# Patient Record
Sex: Female | Born: 1990 | Race: White | Hispanic: No | Marital: Married | Smoking: Current every day smoker
Health system: Southern US, Community
[De-identification: ages and names within clinical notes are randomized; demographics above are authoritative.]

## PROBLEM LIST (undated history)

## (undated) DIAGNOSIS — F32A Depression, unspecified: Secondary | ICD-10-CM

## (undated) DIAGNOSIS — N83209 Unspecified ovarian cyst, unspecified side: Secondary | ICD-10-CM

## (undated) DIAGNOSIS — F4310 Post-traumatic stress disorder, unspecified: Secondary | ICD-10-CM

## (undated) DIAGNOSIS — F329 Major depressive disorder, single episode, unspecified: Secondary | ICD-10-CM

## (undated) DIAGNOSIS — F419 Anxiety disorder, unspecified: Secondary | ICD-10-CM

## (undated) DIAGNOSIS — R569 Unspecified convulsions: Secondary | ICD-10-CM

## (undated) HISTORY — PX: OTHER SURGICAL HISTORY: SHX169

## (undated) HISTORY — DX: Unspecified convulsions: R56.9

---
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
## 2009-02-20 ENCOUNTER — Emergency Department: Payer: Self-pay | Admitting: Emergency Medicine

## 2009-04-30 ENCOUNTER — Ambulatory Visit: Payer: Self-pay | Admitting: Internal Medicine

## 2009-10-21 ENCOUNTER — Emergency Department: Payer: Self-pay | Admitting: Emergency Medicine

## 2010-07-16 ENCOUNTER — Emergency Department: Payer: Self-pay

## 2010-09-30 ENCOUNTER — Ambulatory Visit: Payer: Self-pay | Admitting: Gastroenterology

## 2012-01-17 ENCOUNTER — Ambulatory Visit: Payer: Self-pay | Admitting: Family Medicine

## 2012-01-21 ENCOUNTER — Emergency Department: Payer: Self-pay | Admitting: *Deleted

## 2012-01-22 LAB — COMPREHENSIVE METABOLIC PANEL
Albumin: 3.2 g/dL — ABNORMAL LOW (ref 3.4–5.0)
Alkaline Phosphatase: 52 U/L (ref 50–136)
Anion Gap: 10 (ref 7–16)
BUN: 10 mg/dL (ref 7–18)
Bilirubin,Total: 0.1 mg/dL — ABNORMAL LOW (ref 0.2–1.0)
Chloride: 106 mmol/L (ref 98–107)
Co2: 27 mmol/L (ref 21–32)
EGFR (Non-African Amer.): >60
Glucose: 90 mg/dL (ref 65–99)
Potassium: 3.6 mmol/L (ref 3.5–5.1)
SGOT(AST): 21 U/L (ref 15–37)
Sodium: 143 mmol/L (ref 136–145)
Total Protein: 6.9 g/dL (ref 6.4–8.2)

## 2012-01-22 LAB — CBC
HGB: 12.3 g/dL (ref 12.0–16.0)
MCHC: 34.2 g/dL (ref 32.0–36.0)
RBC: 4.03 10*6/uL (ref 3.80–5.20)
RDW: 13.4 % (ref 11.5–14.5)

## 2012-01-22 LAB — URINALYSIS, COMPLETE
Bacteria: NONE SEEN
Blood: NEGATIVE
Glucose,UR: NEGATIVE mg/dL (ref 0–75)
Ketone: NEGATIVE
Leukocyte Esterase: NEGATIVE
Nitrite: NEGATIVE
Ph: 6 (ref 4.5–8.0)
Protein: NEGATIVE
Squamous Epithelial: 1

## 2012-01-22 LAB — PREGNANCY, URINE: Pregnancy Test, Urine: NEGATIVE m[IU]/mL

## 2012-11-15 ENCOUNTER — Emergency Department (HOSPITAL_COMMUNITY)
Admission: EM | Admit: 2012-11-15 | Discharge: 2012-11-15 | Disposition: A | Discharge status: Home / Self Care | Payer: Self-pay | Attending: Emergency Medicine | Admitting: Emergency Medicine

## 2012-11-15 ENCOUNTER — Encounter (HOSPITAL_COMMUNITY): Payer: Self-pay

## 2012-11-15 ENCOUNTER — Emergency Department (HOSPITAL_COMMUNITY): Payer: Self-pay

## 2012-11-15 DIAGNOSIS — X500XXA Overexertion from strenuous movement or load, initial encounter: Secondary | ICD-10-CM | POA: Insufficient documentation

## 2012-11-15 DIAGNOSIS — Y9210 Unspecified residential institution as the place of occurrence of the external cause: Secondary | ICD-10-CM | POA: Insufficient documentation

## 2012-11-15 DIAGNOSIS — Y93F9 Activity, other caregiving: Secondary | ICD-10-CM | POA: Insufficient documentation

## 2012-11-15 DIAGNOSIS — IMO0002 Reserved for concepts with insufficient information to code with codable children: Secondary | ICD-10-CM | POA: Insufficient documentation

## 2012-11-15 DIAGNOSIS — F17200 Nicotine dependence, unspecified, uncomplicated: Secondary | ICD-10-CM | POA: Insufficient documentation

## 2012-11-15 DIAGNOSIS — Y990 Civilian activity done for income or pay: Secondary | ICD-10-CM | POA: Insufficient documentation

## 2012-11-15 DIAGNOSIS — S0993XA Unspecified injury of face, initial encounter: Secondary | ICD-10-CM | POA: Insufficient documentation

## 2012-11-15 MED ORDER — CYCLOBENZAPRINE HCL 5 MG PO TABS: 5.0000 mg | ORAL_TABLET | Freq: Three times a day (TID) | ORAL | Status: DC | PRN

## 2012-11-15 NOTE — ED Notes (Signed)
Patient transported to X-ray 

## 2012-11-15 NOTE — ED Provider Notes (Signed)
History     CSN: [id number]  Arrival date & time 11/15/12  0865   First MD Initiated Contact with Patient 11/15/12 1853      Chief Complaint  Patient presents with  . Shoulder Pain    (Consider location/radiation/quality/duration/timing/severity/associated sxs/prior treatment) Patient is a 22 y.o. female presenting with shoulder pain. The history is provided by the patient.  Shoulder Pain This is a new problem. The current episode started yesterday. The problem occurs constantly. The problem has been gradually worsening. Pertinent negatives include no chills, fever or headaches. Neck pain: neck pain referred from the shoulder. Exacerbated by: moving right arm. She has tried NSAIDs for the symptoms. The treatment provided mild relief.  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 Pain in left shoulder started last nigh while sitting on the couch. The pain has gotten gradually worse. The pain increases with movement of right arm or pressure at the area of the right shoulder.   History reviewed. No pertinent past medical history.  Past Surgical History  Procedure Laterality Date  . Cesarean section    . Tubes in ears      No family history on file.  History  Substance Use Topics  . Smoking status: Current Every Day Smoker  . Smokeless tobacco: Not on file  . Alcohol Use: Yes     Comment: occ    OB History   Grav Para Term Preterm Abortions TAB SAB Ect Mult Living                  Review of Systems  Constitutional: Negative for fever and chills.  HENT: Neck pain: neck pain referred from the shoulder.   Musculoskeletal:       Right shoulder pain/  Skin: Negative for wound.  Neurological: Negative for headaches.  Psychiatric/Behavioral: Negative for confusion. The patient is not nervous/anxious.     Allergies  Review of patient's allergies indicates no known allergies.  Home Medications   Current Outpatient Rx  Name  Route  Sig  Dispense  Refill  .  buPROPion (WELLBUTRIN SR) 150 MG 12 hr tablet   Oral   Take 150 mg by mouth 2 (two) times daily.           BP 156/94  Pulse 88  Temp(Src) 99.1 F (37.3 C) (Oral)  Resp 18  Ht 5\' 5"  (1.651 m)  Wt 167 lb (75.751 kg)  BMI 27.79 kg/m2  SpO2 99%  LMP 10/30/2012  Physical Exam  Nursing note and vitals reviewed. Constitutional: She is oriented to person, place, and time. She appears well-developed and well-nourished. No distress.  HENT:  Head: Normocephalic and atraumatic.  Eyes: EOM are normal.  Neck: Normal range of motion. Neck supple.  Cardiovascular: Normal rate.   Pulmonary/Chest: Effort normal.  Musculoskeletal:       Right shoulder: She exhibits decreased range of motion, tenderness and spasm. She exhibits no deformity and normal strength.       Arms: Radial pulses strong and equal bilateral. Adequate circulation. Good grips and equal bilateral. Muscle spasm noted posterior aspect of right shoulder extending to scapula.  Neurological: She is alert and oriented to person, place, and time. She has normal strength and normal reflexes. No cranial nerve deficit or sensory deficit.  Skin: Skin is warm and dry.  Psychiatric: She has a normal mood and affect. Her behavior is normal. Judgment and thought content normal.    ED Course  Procedures (including critical care time)  Labs Reviewed - No data to display Dg Shoulder Right  11/15/2012  *RADIOLOGY REPORT*  Clinical Data: Right shoulder pain since last night, no injury  RIGHT SHOULDER - 2+ VIEW  Comparison: None  Findings: AC joint alignment normal. Osseous mineralization normal. No glenohumeral fracture or dislocation. Visualized ribs intact.  IMPRESSION: Normal exam.   Original Report Authenticated By: Ulyses Southward, M.D.     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  The x-ray of the shoulder is normal and patient has no abnormal  neurological findings I will treat her muscle spasm and have her  follow up with ortho as needed.  assessment: Right shoulder strain   Muscle spasm  Plan:  Flexeril   Ibuprofen    I have reviewed this patient's vital signs, nurses notes, appropriate imaging.  I have discussed the results and plan of care with the patient and she voices understanding.    Medication List    TAKE these medications       cyclobenzaprine 5 MG tablet  Commonly known as:  FLEXERIL  Take 1 tablet (5 mg total) by mouth 3 (three) times daily as needed for muscle spasms.      ASK your doctor about these medications       buPROPion 150 MG 12 hr tablet  Commonly known as:  WELLBUTRIN SR  Take 150 mg by mouth 2 (two) times daily.              Janne Napoleon, [place] 11/15/12 2009

## 2012-11-15 NOTE — ED Notes (Signed)
Pt c/o pain in r shoulder that has been gradually getting worse throughout the day.  Posterior shoulder swollen and pain radiates down r arm.

## 2012-11-15 NOTE — ED Notes (Signed)
Pt alert & oriented x4, stable gait. Patient given discharge instructions, paperwork & prescription(s). Patient  instructed to stop at the registration desk to finish any additional paperwork. Patient verbalized understanding. Pt left department w/ no further questions. 

## 2012-11-16 NOTE — ED Provider Notes (Signed)
Medical screening examination/treatment/procedure(s) were performed by non-physician practitioner and as supervising physician I was immediately available for consultation/collaboration. Devoria Albe, MD, Armando Gang   Ward Givens, MD 11/16/12 [phone]

## 2014-06-24 ENCOUNTER — Emergency Department (HOSPITAL_COMMUNITY)
Admission: EM | Admit: 2014-06-24 | Discharge: 2014-06-24 | Disposition: A | Discharge status: Home / Self Care | Payer: Self-pay | Attending: Emergency Medicine | Admitting: Emergency Medicine

## 2014-06-24 ENCOUNTER — Encounter (HOSPITAL_COMMUNITY): Payer: Self-pay | Admitting: Emergency Medicine

## 2014-06-24 ENCOUNTER — Emergency Department (HOSPITAL_COMMUNITY): Payer: Self-pay

## 2014-06-24 DIAGNOSIS — N83299 Other ovarian cyst, unspecified side: Secondary | ICD-10-CM

## 2014-06-24 DIAGNOSIS — N760 Acute vaginitis: Secondary | ICD-10-CM | POA: Insufficient documentation

## 2014-06-24 DIAGNOSIS — Z3202 Encounter for pregnancy test, result negative: Secondary | ICD-10-CM | POA: Insufficient documentation

## 2014-06-24 DIAGNOSIS — R1031 Right lower quadrant pain: Secondary | ICD-10-CM | POA: Insufficient documentation

## 2014-06-24 DIAGNOSIS — Z720 Tobacco use: Secondary | ICD-10-CM | POA: Insufficient documentation

## 2014-06-24 DIAGNOSIS — B9689 Other specified bacterial agents as the cause of diseases classified elsewhere: Secondary | ICD-10-CM

## 2014-06-24 DIAGNOSIS — Z79899 Other long term (current) drug therapy: Secondary | ICD-10-CM | POA: Insufficient documentation

## 2014-06-24 DIAGNOSIS — N8329 Other ovarian cysts: Secondary | ICD-10-CM | POA: Insufficient documentation

## 2014-06-24 HISTORY — DX: Unspecified ovarian cyst, unspecified side: N83.209

## 2014-06-24 LAB — WET PREP, GENITAL
Trich, Wet Prep: NONE SEEN
YEAST WET PREP: NONE SEEN

## 2014-06-24 LAB — URINALYSIS, ROUTINE W REFLEX MICROSCOPIC
BILIRUBIN URINE: NEGATIVE
Glucose, UA: NEGATIVE mg/dL
Ketones, ur: NEGATIVE mg/dL
Leukocytes, UA: NEGATIVE
NITRITE: NEGATIVE
Protein, ur: NEGATIVE mg/dL
UROBILINOGEN UA: 0.2 mg/dL (ref 0.0–1.0)
pH: 5.5 (ref 5.0–8.0)

## 2014-06-24 LAB — URINE MICROSCOPIC-ADD ON

## 2014-06-24 LAB — PREGNANCY, URINE: Preg Test, Ur: NEGATIVE

## 2014-06-24 MED ORDER — KETOROLAC TROMETHAMINE 60 MG/2ML IM SOLN
60.0000 mg | Freq: Once | INTRAMUSCULAR | Status: AC
  Administered 2014-06-24: 60 mg via INTRAMUSCULAR
  Filled 2014-06-24: qty 2

## 2014-06-24 MED ORDER — TRAMADOL HCL 50 MG PO TABS: 100.0000 mg | ORAL_TABLET | Freq: Four times a day (QID) | ORAL | Status: DC | PRN

## 2014-06-24 MED ORDER — METRONIDAZOLE 500 MG PO TABS: 500.0000 mg | ORAL_TABLET | Freq: Two times a day (BID) | ORAL | Status: DC

## 2014-06-24 NOTE — ED Provider Notes (Signed)
CSN: [id number]     Arrival date & time 06/24/14  [id number] History  This chart was scribed for Ward GivensIva L Kazuto Sevey, MD by Karle PlumberJennifer Tensley, ED Scribe. This patient was seen in room APA05/APA05 and the patient's care was started at 10:30 AM.  Chief Complaint  Patient presents with  . Abdominal Pain   HPI HPI Comments:  Deanna Wong is a 23 y.o. female who presents to the Emergency Department complaining of abdominal pain that began two days ago after sex. She reports a sharp pain to the RLQ two days ago. She reports a constant diffuse lower abdominal pain that radiates to her lower back since. She describes the pain as a dull ache and rates it at 6/10. She states bending over or walking makes the pain worse. Nothing makes it feel better except lying still. She reports associated difficulty urinating. Pt reports she started spotting two days ago. Pt is G2 P2 Ab ) She reports two pregnancies and two deliveries with no abortions or miscarriages. Her LNP was 9/23 so she is about 3 days early. Pt is sexually active with one partner.She states she has been trying to get pregnant again for the past 4 years. She does not use any type of birth control. She reports similar pain about 1 year ago when she had a ruptured ovarian cyst. Denies nausea, vomiting, diarrhea, fever or chills. She reports loss of appetite. She has frequency without dysuria and states she feels like it's hard to start urination.  [facility]  Past Medical History  Diagnosis Date  . Ovarian cyst    Past Surgical History  Procedure Laterality Date  . Cesarean section    . Tubes in ears     History reviewed. No pertinent family history. History  Substance Use Topics  . Smoking status: Current Every Day Smoker  . Smokeless tobacco: Not on file  . 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  Gastrointestinal: Positive for abdominal pain. Negative for nausea, vomiting and diarrhea.  Genitourinary: Positive for difficulty urinating.  All other systems reviewed and are negative.  Allergies  Review of patient's allergies indicates no known allergies.  Home Medications   Prior to Admission medications   Medication Sig Start Date End Date Taking? Authorizing Provider  buPROPion (WELLBUTRIN SR) 150 MG 12 hr tablet Take 150 mg by mouth 2 (two) times daily.   Yes Historical Provider, MD  citalopram (CELEXA) 20 MG tablet Take 20 mg by mouth at bedtime.   Yes Historical Provider, MD  ibuprofen (ADVIL,MOTRIN) 200 MG tablet Take 200 mg by mouth every 6 (six) hours as needed for cramping.   Yes Historical Provider, MD  metroNIDAZOLE (FLAGYL) 500 MG tablet Take 1 tablet (500 mg total) by mouth 2 (two) times daily. 06/24/14   Ward GivensIva L Savayah Waltrip, MD  traMADol (ULTRAM) 50 MG tablet Take 2 tablets (100 mg total) by mouth every 6 (six) hours as needed. 06/24/14   Ward GivensIva L Kasara Schomer, MD   Triage Vitals: BP 143/95  Pulse 97  Temp(Src) 98.9 F (37.2 C) (Oral)  Resp 16  Ht 5\' 5"  (1.651 m)  Wt 165 lb (74.844 kg)  BMI 27.46 kg/m2  SpO2 98%  LMP 06/24/2014   Vital signs normal    Physical Exam  Nursing note and vitals reviewed. Constitutional: She is oriented to person, place, and time. She appears well-developed and well-nourished.  Non-toxic appearance. She does not appear ill. No distress.  HENT:  Head: Normocephalic and atraumatic.  Right Ear: External ear normal.  Left Ear: External ear normal.  Nose: Nose normal. No mucosal edema or rhinorrhea.  Mouth/Throat: Oropharynx is clear and moist and mucous membranes are normal. No dental abscesses or uvula swelling.  Eyes: Conjunctivae and EOM are normal. Pupils are equal, round, and reactive to light.  Neck: Normal range of motion  and full passive range of motion without pain. Neck supple.  Cardiovascular: Normal rate, regular rhythm and normal heart sounds.  Exam reveals no gallop and no friction rub.   No murmur heard. Pulmonary/Chest: Effort normal and breath sounds normal. No respiratory distress. She has no wheezes. She has no rhonchi. She has no rales. She exhibits no tenderness and no crepitus.  Abdominal: Soft. Normal appearance and bowel sounds are normal. She exhibits no distension. There is tenderness in the right lower quadrant and suprapubic area. There is no rebound, no guarding and no CVA tenderness.    Mild epigastric tenderness and tenderness diffusely from umbilicus down, but worse at suprapubic region and RLQ. No CVAT bilaterally  Genitourinary:  Normal external genitilia. Pt has small amount of blood in the vault. She is tender diffusely to palpation of her normal sized uterus and both adenexa without obvious masses.   Musculoskeletal: Normal range of motion. She exhibits no edema and no tenderness.  Moves all extremities well. Indicates the pain is more at the sacral region.  Neurological: She is alert and oriented to person, place, and time. She has normal strength. No cranial nerve deficit.  Skin: Skin is warm, dry and intact. No rash noted. No erythema. No pallor.  Psychiatric: She has a normal mood and affect. Her speech is normal and behavior is normal. Her mood appears not anxious.    ED Course  Procedures (including critical care time)  Medications  ketorolac (TORADOL) injection 60 mg (60 mg Intramuscular Given 06/24/14 1050)    DIAGNOSTIC STUDIES: Oxygen Saturation is 98% on RA, normal by my interpretation.   COORDINATION OF CARE: 10:39 AM- Will order urinalysis, ultrasound, pain medication, and perform pelvic exam. Pt verbalizes understanding and agrees to plan.  PT driving herself home. The toradol helped until she had the [place], does not want to get acetaminophen before she leaves.    Labs Review Results for orders placed during the hospital encounter of 06/24/14  WET PREP, GENITAL      Result Value Ref Range   Yeast Wet Prep HPF POC NONE SEEN  NONE SEEN   Trich, Wet Prep NONE SEEN  NONE SEEN   Clue Cells Wet Prep HPF POC FEW (*) NONE SEEN   WBC, Wet Prep HPF POC RARE (*) NONE SEEN  URINALYSIS, ROUTINE W REFLEX MICROSCOPIC      Result Value Ref Range   Color, Urine YELLOW  YELLOW   APPearance CLEAR  CLEAR   Specific Gravity, Urine <1.005 (*) 1.005 - 1.030   pH 5.5  5.0 - 8.0   Glucose, UA NEGATIVE  NEGATIVE mg/dL   Hgb urine dipstick MODERATE (*) NEGATIVE   Bilirubin Urine NEGATIVE  NEGATIVE   Ketones, ur NEGATIVE  NEGATIVE mg/dL   Protein, ur  NEGATIVE  NEGATIVE mg/dL   Urobilinogen, UA 0.2  0.0 - 1.0 mg/dL   Nitrite NEGATIVE  NEGATIVE   Leukocytes, UA NEGATIVE  NEGATIVE  PREGNANCY, URINE      Result Value Ref Range   Preg Test, Ur NEGATIVE  NEGATIVE  URINE MICROSCOPIC-ADD ON      Result Value Ref Range   Squamous Epithelial / LPF FEW (*) RARE   RBC / HPF 7-10  <3 RBC/hpf     Laboratory interpretation all normal except BV   Imaging Review [place] Transvaginal Non-ob  [place] Pelvis Complete  06/24/2014   CLINICAL DATA:  Right lower quadrant abdominal pain  EXAM: TRANSABDOMINAL AND TRANSVAGINAL ULTRASOUND OF PELVIS  TECHNIQUE: Both transabdominal and transvaginal ultrasound examinations of the pelvis were performed. Transabdominal technique was performed for global imaging of the pelvis including uterus, ovaries, adnexal regions, and pelvic cul-de-sac. It was necessary to proceed with endovaginal exam following the transabdominal exam to visualize the ovaries and adnexae in better detail.  COMPARISON:  None  FINDINGS: Uterus  Measurements: 8.0 x 4.6 x 5.3 cm. No fibroids or other mass visualized.  Endometrium  Thickness: 9.2 mm.  No focal abnormality visualized.  Right ovary  Measurements: 4.0 x 2.9 x 3.2 cm. Right ovary contains a 2.6 x 1.4 x 2.6 cm minimally  complex septated cyst. No associated vascularity.  Left ovary  Measurements: 3.0 x 1.4 x 2.8 cm. Normal appearance/no adnexal mass.  Other findings  Only trace pelvic free fluid, likely physiologic.  IMPRESSION: 2.6 cm minimally complex septated right ovarian cyst. Otherwise normal exam.   Electronically Signed   By: Ruel Favorsrevor  Shick M.D.   On: 06/24/2014 12:18     EKG Interpretation None      MDM   Final diagnoses:  Abdominal pain, RLQ  BV (bacterial vaginosis)  Complex ovarian cyst    Discharge Medication List as of 06/24/2014  1:37 PM    flagyl 500 mg BID x 7d Tramadol # 16  Devoria AlbeIva Magnum Lunde, MD, FACEP    I personally performed the services described in this documentation, which was scribed in my presence. The recorded information has been reviewed and considered.  Devoria AlbeIva Lonnie Reth, MD, FACEP     Ward GivensIva L Gianny Killman, MD 06/24/14 [phone]

## 2014-06-24 NOTE — Discharge Instructions (Signed)
Take ibuprofen 600 mg + acetaminophen 1000 mg 4 times a day for pain. Take the tramadol for pain. Take the antibiotic until gone. Recheck if you get a fever, vomiting or the pain seems worse. You should have the cyst rechecked by a GYN in the next couple of weeks.    Bacterial Vaginosis Bacterial vaginosis is a vaginal infection that occurs when the normal balance of bacteria in the vagina is disrupted. It results from an overgrowth of certain bacteria. This is the most common vaginal infection in women of childbearing age. Treatment is important to prevent complications, especially in pregnant women, as it can cause a premature delivery. CAUSES  Bacterial vaginosis is caused by an increase in harmful bacteria that are normally present in smaller amounts in the vagina. Several different kinds of bacteria can cause bacterial vaginosis. However, the reason that the condition develops is not fully understood. RISK FACTORS Certain activities or behaviors can put you at an increased risk of developing bacterial vaginosis, including:  Having a new sex partner or multiple sex partners.  Douching.  Using an intrauterine device (IUD) for contraception. Women do not get bacterial vaginosis from toilet seats, bedding, swimming pools, or contact with objects around them. SIGNS AND SYMPTOMS  Some women with bacterial vaginosis have no signs or symptoms. Common symptoms include:  Grey vaginal discharge.  A fishlike odor with discharge, especially after sexual intercourse.  Itching or burning of the vagina and vulva.  Burning or pain with urination. DIAGNOSIS  Your health care provider will take a medical history and examine the vagina for signs of bacterial vaginosis. A sample of vaginal fluid may be taken. Your health care provider will look at this sample under a microscope to check for bacteria and abnormal cells. A vaginal pH test may also be done.  TREATMENT  Bacterial vaginosis may be treated  with antibiotic medicines. These may be given in the form of a pill or a vaginal cream. A second round of antibiotics may be prescribed if the condition comes back after treatment.  HOME CARE INSTRUCTIONS   Only take over-the-counter or prescription medicines as directed by your health care provider.  If antibiotic medicine was prescribed, take it as directed. Make sure you finish it even if you start to feel better.  Do not have sex until treatment is completed.  Tell all sexual partners that you have a vaginal infection. They should see their health care provider and be treated if they have problems, such as a mild rash or itching.  Practice safe sex by using condoms and only having one sex partner. SEEK MEDICAL CARE IF:   Your symptoms are not improving after 3 days of treatment.  You have increased discharge or pain.  You have a fever. MAKE SURE YOU:   Understand these instructions.  Will watch your condition.  Will get help right away if you are not doing well or get worse. FOR MORE INFORMATION  Centers for Disease Control and Prevention, Division of STD Prevention: SolutionApps.co.zawww.cdc.gov/std American Sexual Health Association (ASHA): www.ashastd.org  Document Released: 08/23/2005 Document Revised: 06/13/2013 Document Reviewed: 04/04/2013 [facility] Patient Information 2015 [place], [place]. This information is not intended to replace advice given to you by your health care provider. Make sure you discuss any questions you have with your health care provider.  Ovarian Cyst An ovarian cyst is a fluid-filled sac that forms on an ovary. The ovaries are small organs that produce eggs in women. Various types of cysts can form  on the ovaries. Most are not cancerous. Many do not cause problems, and they often go away on their own. Some may cause symptoms and require treatment. Common types of ovarian cysts include:  Functional cysts--These cysts may occur every month during the menstrual cycle.  This is normal. The cysts usually go away with the next menstrual cycle if the woman does not get pregnant. Usually, there are no symptoms with a functional cyst.  Endometrioma cysts--These cysts form from the tissue that lines the uterus. They are also called "chocolate cysts" because they become filled with blood that turns brown. This type of cyst can cause pain in the lower abdomen during intercourse and with your menstrual period.  Cystadenoma cysts--This type develops from the cells on the outside of the ovary. These cysts can get very big and cause lower abdomen pain and pain with intercourse. This type of cyst can twist on itself, cut off its blood supply, and cause severe pain. It can also easily rupture and cause a lot of pain.  Dermoid cysts--This type of cyst is sometimes found in both ovaries. These cysts may contain different kinds of body tissue, such as skin, teeth, hair, or cartilage. They usually do not cause symptoms unless they get very big.  Theca lutein cysts--These cysts occur when too much of a certain hormone (human chorionic gonadotropin) is produced and overstimulates the ovaries to produce an egg. This is most common after procedures used to assist with the conception of a baby (in vitro fertilization). CAUSES   Fertility drugs can cause a condition in which multiple large cysts are formed on the ovaries. This is called ovarian hyperstimulation syndrome.  A condition called polycystic ovary syndrome can cause hormonal imbalances that can lead to nonfunctional ovarian cysts. SIGNS AND SYMPTOMS  Many ovarian cysts do not cause symptoms. If symptoms are present, they may include:  Pelvic pain or pressure.  Pain in the lower abdomen.  Pain during sexual intercourse.  Increasing girth (swelling) of the abdomen.  Abnormal menstrual periods.  Increasing pain with menstrual periods.  Stopping having menstrual periods without being pregnant. DIAGNOSIS  These cysts  are commonly found during a routine or annual pelvic exam. Tests may be ordered to find out more about the cyst. These tests may include:  Ultrasound.  X-ray of the pelvis.  CT scan.  MRI.  Blood tests. TREATMENT  Many ovarian cysts go away on their own without treatment. Your health care provider may want to check your cyst regularly for 2-3 months to see if it changes. For women in menopause, it is particularly important to monitor a cyst closely because of the higher rate of ovarian cancer in menopausal women. When treatment is needed, it may include any of the following:  A procedure to drain the cyst (aspiration). This may be done using a long needle and ultrasound. It can also be done through a laparoscopic procedure. This involves using a thin, lighted tube with a tiny camera on the end (laparoscope) inserted through a small incision.  Surgery to remove the whole cyst. This may be done using laparoscopic surgery or an open surgery involving a larger incision in the lower abdomen.  Hormone treatment or birth control pills. These methods are sometimes used to help dissolve a cyst. HOME CARE INSTRUCTIONS   Only take over-the-counter or prescription medicines as directed by your health care provider.  Follow up with your health care provider as directed.  Get regular pelvic exams and Pap tests. SEEK  MEDICAL CARE IF:   Your periods are late, irregular, or painful, or they stop.  Your pelvic pain or abdominal pain does not go away.  Your abdomen becomes larger or swollen.  You have pressure on your bladder or trouble emptying your bladder completely.  You have pain during sexual intercourse.  You have feelings of fullness, pressure, or discomfort in your stomach.  You lose weight for no apparent reason.  You feel generally ill.  You become constipated.  You lose your appetite.  You develop acne.  You have an increase in body and facial hair.  You are gaining  weight, without changing your exercise and eating habits.  You think you are pregnant. SEEK IMMEDIATE MEDICAL CARE IF:   You have increasing abdominal pain.  You feel sick to your stomach (nauseous), and you throw up (vomit).  You develop a fever that comes on suddenly.  You have abdominal pain during a bowel movement.  Your menstrual periods become heavier than usual. MAKE SURE YOU:  Understand these instructions.  Will watch your condition.  Will get help right away if you are not doing well or get worse. Document Released: 08/23/2005 Document Revised: 08/28/2013 Document Reviewed: 04/30/2013 [facility] Patient Information 2015 [place], [place]. This information is not intended to replace advice given to you by your health care provider. Make sure you discuss any questions you have with your health care provider.

## 2014-06-24 NOTE — ED Notes (Addendum)
Patient states "I think I have an ovarian cyst and it popped on Saturday." Patient states patient started in right lower quadrant on Sunday and today complaining of abdominal pain in bilateral lower quadrants with spotting. States she called PCP this morning and was told to come to ED.

## 2014-06-24 NOTE — ED Notes (Signed)
Waiting on prescriptions from EDP before discharge can take place.

## 2014-06-25 LAB — GC/CHLAMYDIA PROBE AMP
CT Probe RNA: NEGATIVE
GC Probe RNA: NEGATIVE

## 2014-06-25 LAB — RPR

## 2014-06-25 LAB — HIV ANTIBODY (ROUTINE TESTING W REFLEX): HIV 1&2 Ab, 4th Generation: NONREACTIVE

## 2015-02-25 ENCOUNTER — Emergency Department (HOSPITAL_COMMUNITY): Payer: Self-pay

## 2015-02-25 ENCOUNTER — Emergency Department (HOSPITAL_COMMUNITY)
Admission: EM | Admit: 2015-02-25 | Discharge: 2015-02-25 | Disposition: A | Discharge status: Home / Self Care | Payer: Self-pay | Attending: Emergency Medicine | Admitting: Emergency Medicine

## 2015-02-25 ENCOUNTER — Encounter (HOSPITAL_COMMUNITY): Payer: Self-pay

## 2015-02-25 DIAGNOSIS — R410 Disorientation, unspecified: Secondary | ICD-10-CM

## 2015-02-25 DIAGNOSIS — Z3202 Encounter for pregnancy test, result negative: Secondary | ICD-10-CM | POA: Insufficient documentation

## 2015-02-25 DIAGNOSIS — Z79899 Other long term (current) drug therapy: Secondary | ICD-10-CM | POA: Insufficient documentation

## 2015-02-25 DIAGNOSIS — Z720 Tobacco use: Secondary | ICD-10-CM | POA: Insufficient documentation

## 2015-02-25 DIAGNOSIS — Z8742 Personal history of other diseases of the female genital tract: Secondary | ICD-10-CM | POA: Insufficient documentation

## 2015-02-25 HISTORY — DX: Anxiety disorder, unspecified: F41.9

## 2015-02-25 HISTORY — DX: Major depressive disorder, single episode, unspecified: F32.9

## 2015-02-25 HISTORY — DX: Depression, unspecified: F32.A

## 2015-02-25 LAB — URINALYSIS, ROUTINE W REFLEX MICROSCOPIC
BILIRUBIN URINE: NEGATIVE
Glucose, UA: NEGATIVE mg/dL
HGB URINE DIPSTICK: NEGATIVE
KETONES UR: NEGATIVE mg/dL
Leukocytes, UA: NEGATIVE
Nitrite: NEGATIVE
Protein, ur: NEGATIVE mg/dL
SPECIFIC GRAVITY, URINE: 1.01 (ref 1.005–1.030)
UROBILINOGEN UA: 0.2 mg/dL (ref 0.0–1.0)
pH: 5.5 (ref 5.0–8.0)

## 2015-02-25 LAB — SALICYLATE LEVEL

## 2015-02-25 LAB — COMPREHENSIVE METABOLIC PANEL
ALT: 16 U/L (ref 14–54)
ANION GAP: 7 (ref 5–15)
AST: 23 U/L (ref 15–41)
Albumin: 3.8 g/dL (ref 3.5–5.0)
Alkaline Phosphatase: 34 U/L — ABNORMAL LOW (ref 38–126)
BUN: 10 mg/dL (ref 6–20)
CALCIUM: 8.4 mg/dL — AB (ref 8.9–10.3)
CO2: 25 mmol/L (ref 22–32)
Chloride: 106 mmol/L (ref 101–111)
Creatinine, Ser: 0.79 mg/dL (ref 0.44–1.00)
Glucose, Bld: 100 mg/dL — ABNORMAL HIGH (ref 65–99)
Potassium: 3.7 mmol/L (ref 3.5–5.1)
Sodium: 138 mmol/L (ref 135–145)
Total Bilirubin: 0.5 mg/dL (ref 0.3–1.2)
Total Protein: 6.6 g/dL (ref 6.5–8.1)

## 2015-02-25 LAB — CBC WITH DIFFERENTIAL/PLATELET
Basophils Absolute: 0 10*3/uL (ref 0.0–0.1)
Basophils Relative: 1 % (ref 0–1)
EOS ABS: 0.3 10*3/uL (ref 0.0–0.7)
Eosinophils Relative: 4 % (ref 0–5)
HCT: 42.8 % (ref 36.0–46.0)
HEMOGLOBIN: 14.4 g/dL (ref 12.0–15.0)
LYMPHS ABS: 2.1 10*3/uL (ref 0.7–4.0)
LYMPHS PCT: 32 % (ref 12–46)
MCH: 32.1 pg (ref 26.0–34.0)
MCHC: 33.6 g/dL (ref 30.0–36.0)
MCV: 95.3 fL (ref 78.0–100.0)
MONOS PCT: 7 % (ref 3–12)
Monocytes Absolute: 0.4 10*3/uL (ref 0.1–1.0)
NEUTROS ABS: 3.8 10*3/uL (ref 1.7–7.7)
NEUTROS PCT: 56 % (ref 43–77)
Platelets: 205 10*3/uL (ref 150–400)
RBC: 4.49 MIL/uL (ref 3.87–5.11)
RDW: 12.4 % (ref 11.5–15.5)
WBC: 6.7 10*3/uL (ref 4.0–10.5)

## 2015-02-25 LAB — ACETAMINOPHEN LEVEL: Acetaminophen (Tylenol), Serum: <10 ug/mL — ABNORMAL LOW (ref 10–30)

## 2015-02-25 LAB — RAPID URINE DRUG SCREEN, HOSP PERFORMED
Amphetamines: NOT DETECTED
Barbiturates: NOT DETECTED
Benzodiazepines: NOT DETECTED
Cocaine: NOT DETECTED
Opiates: NOT DETECTED
Tetrahydrocannabinol: NOT DETECTED

## 2015-02-25 LAB — PREGNANCY, URINE: PREG TEST UR: NEGATIVE

## 2015-02-25 LAB — ETHANOL

## 2015-02-25 MED ORDER — SODIUM CHLORIDE 0.9 % IV BOLUS (SEPSIS)
1000.0000 mL | Freq: Once | INTRAVENOUS | Status: AC
  Administered 2015-02-25: 1000 mL via INTRAVENOUS

## 2015-02-25 MED ORDER — SODIUM CHLORIDE 0.9 % IV SOLN
INTRAVENOUS | Status: DC
  Administered 2015-02-25: 13:00:00 via INTRAVENOUS

## 2015-02-25 NOTE — ED Notes (Signed)
Patient transported to CT 

## 2015-02-25 NOTE — ED Notes (Signed)
Pt ambulated around nursing station with steady gait and appropriate conversation. Vital signs stable upon return to room.

## 2015-02-25 NOTE — Discharge Instructions (Signed)
°Emergency Department Resource Guide °1) Find a Doctor and Pay Out of Pocket °Although you won't have to find out who is covered by your insurance plan, it is a good idea to ask around and get recommendations. You will then need to call the office and see if the doctor you have chosen will accept you as a new patient and what types of options they offer for patients who are self-pay. Some doctors offer discounts or will set up payment plans for their patients who do not have insurance, but you will need to ask so you aren't surprised when you get to your appointment. ° °2) Contact Your Local Health Department °Not all health departments have doctors that can see patients for sick visits, but many do, so it is worth a call to see if yours does. If you don't know where your local health department is, you can check in your phone book. The CDC also has a tool to help you locate your state's health department, and many state websites also have listings of all of their local health departments. ° °3) Find a Walk-in Clinic °If your illness is not likely to be very severe or complicated, you may want to try a walk in clinic. These are popping up all over the country in pharmacies, drugstores, and shopping centers. They're usually staffed by nurse practitioners or physician assistants that have been trained to treat common illnesses and complaints. They're usually fairly quick and inexpensive. However, if you have serious medical issues or chronic medical problems, these are probably not your best option. ° °No Primary Care Doctor: °- Call [facility] at  832-8000 - they can help you locate a primary care doctor that  accepts your insurance, provides certain services, etc. °- Physician Referral Service- 1-800-533-3463 ° °Chronic Pain Problems: °Organization         Address  Phone   Notes  °[facility]  (336) 297-2271 Patients need to be referred by their primary care doctor.  ° °Medication  Assistance: °Organization         Address  Phone   Notes  °[place] [facility] [address] °[place], [place] [address] (336) 641-8030 --Must be a resident of [place] °-- Must have NO insurance coverage whatsoever (no Medicaid/ Medicare, etc.) °-- The pt. MUST have a primary care doctor that directs their care regularly and follows them in the community °  °[facility]  (866) 331-1348   °[employer]  (888) 892-1162   ° °Agencies that provide inexpensive medical care: °Organization         Address  Phone   Notes  °[facility]  (336) 832-8035   °[facility]    (336) 832-7272   °[facility] [address] °[place], [place] [address] (336) 832-4777   °[facility] [address], °[place] (336) 271-4999   °[facility]    (336) 373-0678   °[facility]    (336) 272-1050   °[facility] ° [address], [place] Phone:  (336) 832-4444, Fax:  (336) 832-4440 Hours of Operation:  9 am - 6 pm, M-F.  Also accepts Medicaid/Medicare and self-pay.  ° [facility] ° [address],  Phone: (336) 832-3150, Fax: (336) [phone]. Hours of Operation:  8:30 am - 5:30 pm, M-F.  Also accepts Medicaid and self-pay.  °[facility] [place] [address], [place] Phone: (336) 878-6027   °[facility] [address], [place], [place] (336)723-1848, Ext. 123 Mondays & Thursdays: 7-9 AM.  First 15 patients are seen on a first come, first serve basis. °  ° °Medicaid-accepting [place] Providers: ° °Organization         Address  Phone   Notes  °[facility] [address], [place] (336) 641-2100 Also accepts self-pay patients.  °[facility] [address], [place] ° (336) 856-9996   °[facility] [address], [place]  (336) 288-8857   °[facility] [address] [place] [address], [place] (336) 299-7000   °Veita Bland [address], [place]  ° (336) 373-1557 Only accepts [place] Access Medicaid patients after they have their name applied to their card.  ° °Self-Pay (no insurance) in [place]: ° °Organization         Address  Phone   Notes  °Sickle Cell Patients, [facility] [address], [place] (336) 832-1970   °[facility] [address], [place] (336) 832-4400   °[facility] ° [address], [place] (336) 992-4800   °[facility]/Dr. Osei-Bonsu ° [address] [place] [address], [place] or [address], [place] (336) [phone] Phone number for both [place] and [place] locations is the same.  °[facility] [address], [place] (336) 299-0000   °[facility] [address] [place] [address], [place] or [address] (336) 852-7530 °(336) 878-2260   °[facility] [address], [place] (336) 350-1642, phone; (336) 294-5005, fax Sees patients 1st and 3rd Saturday of every month.  Must not qualify for public or private insurance (i.e. Medicaid, Medicare, [place] Health Choice, Veterans' Benefits) • Household income should be no more than 200% of the poverty level •The clinic cannot treat you if you are pregnant or think you are pregnant • Sexually transmitted diseases are not treated at the clinic.  ° ° °Dental Care: °Organization         Address  Phone  Notes  °[place] [facility] [address], [place] (336) 641-6152 Accepts children up to age 21 who are enrolled in Medicaid or [place] Health Choice; pregnant women with a Medicaid card; and children who have applied for Medicaid or [place] Health Choice, but were declined, whose parents can pay a reduced fee at time of service.  °[place]  [facility] [place]  [address], [place] (336) 641-7733 Accepts children up to age 21 who are enrolled in Medicaid or [place] Health Choice; pregnant women with a Medicaid card; and children who have applied for Medicaid or [place] Health Choice, but were declined, whose parents can pay a reduced fee at time of service.  °[facility] ° [address], [place] (336) 641-4533 Patients are seen by appointment only. Walk-ins are not accepted. [facility] will see patients 18 years of age and older. °Monday - Tuesday (8am-5pm) °Most Wednesdays (8:30-5pm) °$30 per visit, cash only  °[facility] ° [address], [place] (336) 641-4533 Patients are seen by appointment only. Walk-ins are not accepted. [facility] will see patients 18 years of age and older. °One   Wednesday Evening (Monthly: Volunteer Based).  $30 per visit, cash only  °[facility]  (919) 537-3737 for adults; Children under age 4, call Graduate Pediatric Dentistry at ([phone]) [phone]. Children aged 4-14, please call (919) [phone] to request a pediatric application. ° Dental services are provided in all areas of dental care including fillings, crowns and bridges, complete and partial dentures, implants, gum treatment, root canals, and extractions. Preventive care is also provided. Treatment is provided to both adults and children. °Patients are selected via a lottery and there is often a waiting list. °  °[facility] [address], °[place] ° (336) 763-8833 [email or web address] °  °[facility] [address], [place], [place] (336)723-1848, Ext. 123 Second and Fourth Thursday of each month, opens at 6:30 AM; Clinic ends at 9 AM.  Patients are seen on a first-come first-served basis, and a limited number are seen during each clinic.  ° °[facility] ° [address], [place], [place] (336) 723-7904    Eligibility Requirements °You must have lived in [place], [place], or [place] counties for at least the last three months. °  You cannot be eligible for state or federal sponsored healthcare insurance, including Veterans Administration, Medicaid, or Medicare. °  You generally cannot be eligible for healthcare insurance through your employer.  °  How to apply: °Eligibility screenings are held every Tuesday and Wednesday afternoon from 1:00 pm until 4:00 pm. You do not need an appointment for the interview!  °[facility] [address], [place], [place] 336-631-2330   °[facility]  336-342-8273   °[facility]  336-703-3100   °[facility]  336-570-6415   ° °Behavioral Health Resources in the Community: °Intensive Outpatient Programs °Organization         Address  Phone  Notes  °[facility] [address], [place], [place] 336-878-6098   °[facility] [address], [place], [place] 336-832-9800   °[facility] [address], [place], [place] ° 336-882-2125   °[facility] [address],  °[place], [place] 1-800-853-5163 or 336-641-4981   °Substance Abuse Resources °Organization         Address  Phone  Notes  °[facility]  336-882-2125   °[facility]  336-784-9470   °[facility]  336-285-9073   °[facility]  336-845-3988   °Residential & Outpatient Substance Abuse Program  1-800-659-3381   °Psychological Services °Organization         Address  Phone  Notes  °[facility]  336- 832-9600   °[facility]  336- 378-7881   °[facility] [address], [place] 1-800-853-5163 or 336-641-4981   ° °Mobile Crisis Teams °Organization         Address  Phone  Notes  °[facility], Mobile Crisis Care Unit  1-877-626-1772   °Assertive °[facility] ° [address]  [place], [place] 336-834-9664   °Sharon DeEsch [address] °[place] 336-554-5454   ° °Self-Help/Support Groups °Organization         Address  Phone             Notes  °[facility] [place] - variety of support groups  336- 373-1402 Call for more information  °Narcotics Anonymous (NA), [facility] [address], °[place]  2 meetings at this location  ° °  Residential Treatment Programs °Organization         Address  Phone  Notes  °[facility] [address],    °[place]  1-866-801-8205   °[facility] ° [address], [place], [place] 704-293-8524   °[facility] [address], [place] 336-845-3988 Admissions: 8am-3pm M-F  °[facility] [address]    °[place], [place] 336-841-1104   °[facility] [address], [place], [place] 336-379-7146   °[facility] [address]  °[place], [place] 336-285-9073   °[facility] - Intensive Outpatient [address], [place], [place] 336-852-3033   °[facility] [address]  °[place], [place] 1-877-615-2722 or 336-784-9470   °[facility] [address] , [place] 336-227-7417 Accepts Medicaid  °[facility] [address]  °[place] 1-800-659-3381 Substance Abuse/Addiction Treatment  ° °[place] Behavioral Health Resources °Organization         Address  Phone  Notes  °[facility]  (888) 581-9988   °Julie Brannon, PhD [address] [place], [place]   (336) 349-5553 or (336) 951-0000   °[facility]   [address] °[place], [place] (336) 349-4454   °[facility] [address], [place], [place] (336) 342-8316 Insurance/Medicaid/sponsorship through [facility]  °[facility] [address]                                    [place], [place] (336) 342-8316 Therapy/tele-psych/case    °[facility] [address]  ° [place], [place] (336) 349-2233    °Dr. Arfeen  (336) 349-4544   °[facility] [place]  [employer] [place] [facility] 1) [address],  °2) [address], [place] °3)  [address], [place] (336) 349-3220 °(336) 342-7768 ° °(336) 342-8140   °[place] [facility] (336) 342-1394 or (336) 342-3537 (After Hours)    ° ° °Take your usual prescriptions as previously directed.  Call your regular medical doctor today to schedule a follow up appointment within the next 2 days.  Return to the Emergency Department immediately sooner if worsening.  ° °

## 2015-02-25 NOTE — ED Provider Notes (Signed)
CSN: [id number]     Arrival date & time 02/25/15  4098 History   First MD Initiated Contact with Patient 02/25/15 1009     Chief Complaint  Patient presents with  . Altered Mental Status      HPI  Pt was seen at 1010. Per pt, c/o gradual onset and persistence of waxing and waning "confusion" that began yesterday. Pt states 2 days ago she "drove home from the beach and I was fine." Pt states she woke up yesterday and today "not feeling well. " Pt states she "feels confused" and "sound like I'm drunk when I talk." States she was "confused about directions when I was driving here." Has been associated with nausea.  Denies new meds/change in meds. Denies head trauma, no headache, no focal motor weakness, no tingling/numbness in extremities, no CP/SOB, no abd pain, no vomiting/diarrhea, no fevers, no syncope.     Past Medical History  Diagnosis Date  . Ovarian cyst   . Depression   . Anxiety    Past Surgical History  Procedure Laterality Date  . Cesarean section    . Tubes in ears      History  Substance Use Topics  . Smoking status: Current Every Day Smoker  . Smokeless tobacco: Not on file  . Alcohol Use: Yes     Comment: occ    Review of Systems ROS: Statement: All systems negative except as marked or noted in the HPI; Constitutional: Negative for fever and chills. ; ; Eyes: Negative for eye pain, redness and discharge. ; ; ENMT: Negative for ear pain, hoarseness, nasal congestion, sinus pressure and sore throat. ; ; Cardiovascular: Negative for chest pain, palpitations, diaphoresis, dyspnea and peripheral edema. ; ; Respiratory: Negative for cough, wheezing and stridor. ; ; Gastrointestinal: +nausea. Negative for vomiting, diarrhea, abdominal pain, blood in stool, hematemesis, jaundice and rectal bleeding. . ; ; Genitourinary: Negative for dysuria, flank pain and hematuria. ; ; Musculoskeletal: Negative for back pain and neck pain. Negative for swelling and trauma.; ; Skin: Negative  for pruritus, rash, abrasions, blisters, bruising and skin lesion.; ; Neuro: +"confusion." Negative for headache, lightheadedness and neck stiffness. Negative for weakness, altered level of consciousness , extremity weakness, paresthesias, involuntary movement, seizure and syncope.     Allergies  Review of patient's allergies indicates no known allergies.  Home Medications   Prior to Admission medications   Medication Sig Start Date End Date Taking? Authorizing Provider  buPROPion (WELLBUTRIN SR) 150 MG 12 hr tablet Take 150 mg by mouth 2 (two) times daily.   Yes Historical Provider, MD  ibuprofen (ADVIL,MOTRIN) 200 MG tablet Take 200 mg by mouth every 6 (six) hours as needed for cramping.   Yes Historical Provider, MD  Ibuprofen (MIDOL) 200 MG CAPS Take 400 mg by mouth daily as needed (cramps).   Yes Historical Provider, MD  venlafaxine (EFFEXOR) 37.5 MG tablet Take 50 mg by mouth 2 (two) times daily with a meal.  12/20/14  Yes Historical Provider, MD   BP 128/93 mmHg  Pulse 89  Temp(Src) 97.9 F (36.6 C) (Oral)  Resp 18  Ht 5\' 5"  (1.651 m)  Wt 165 lb (74.844 kg)  BMI 27.46 kg/m2  SpO2 99%  LMP 02/18/2015   10:16:43 Orthostatic Vital Signs PR  Orthostatic Lying  - BP- Lying: 121/86 mmHg ; Pulse- Lying: 80  Orthostatic Sitting - BP- Sitting: 133/98 mmHg ; Pulse- Sitting: 82  Orthostatic Standing at 0 minutes - BP- Standing at 0 minutes: 136/100  mmHg ; Pulse- Standing at 0 minutes: 88      Physical Exam  1015: Physical examination:  Nursing notes reviewed; Vital signs and O2 SAT reviewed;  Constitutional: Well developed, Well nourished, Well hydrated, In no acute distress; Head:  Normocephalic, atraumatic; Eyes: EOMI, PERRL, No scleral icterus; ENMT: Mouth and pharynx normal, Mucous membranes moist; Neck: Supple, Full range of motion, No lymphadenopathy. No meningeal signs.; Cardiovascular: Regular rate and rhythm, No murmur, rub, or gallop; Respiratory: Breath sounds clear & equal  bilaterally, No rales, rhonchi, wheezes.  Speaking full sentences with ease, Normal respiratory effort/excursion; Chest: Nontender, Movement normal; Abdomen: Soft, Nontender, Nondistended, Normal bowel sounds; Genitourinary: No CVA tenderness; Extremities: Pulses normal, No tenderness, No edema, No calf edema or asymmetry.; Neuro: AA&Ox3, Major CN grossly intact. Speech clear.  No facial droop.  No nystagmus. Grips equal. Strength 5/5 equal bilat UE's and LE's.  DTR 2/4 equal bilat UE's and LE's.  No gross sensory deficits.  Normal cerebellar testing bilat UE's (finger-nose) and LE's (heel-shin)..; Skin: Color normal, Warm, Dry.   ED Course  Procedures     EKG Interpretation   Date/Time:  Tuesday February 25 2015 10:32:48 EDT Ventricular Rate:  79 PR Interval:  138 QRS Duration: 79 QT Interval:  371 QTC Calculation: 425 R Axis:   66 Text Interpretation:  Sinus rhythm Borderline T abnormalities, inferior  leads No old tracing to compare Confirmed by [facility]  MD, Nicholos Johns  [phone]) on 02/25/2015 10:40:34 AM      MDM  MDM Reviewed: previous chart, nursing note and vitals Reviewed previous: labs Interpretation: labs, x-ray and CT scan     Results for orders placed or performed during the hospital encounter of 02/25/15  Urinalysis, Routine w reflex microscopic (not at [facility])  Result Value Ref Range   Color, Urine YELLOW YELLOW   APPearance CLEAR CLEAR   Specific Gravity, Urine 1.010 1.005 - 1.030   pH 5.5 5.0 - 8.0   Glucose, UA NEGATIVE NEGATIVE mg/dL   Hgb urine dipstick NEGATIVE NEGATIVE   Bilirubin Urine NEGATIVE NEGATIVE   Ketones, ur NEGATIVE NEGATIVE mg/dL   Protein, ur NEGATIVE NEGATIVE mg/dL   Urobilinogen, UA 0.2 0.0 - 1.0 mg/dL   Nitrite NEGATIVE NEGATIVE   Leukocytes, UA NEGATIVE NEGATIVE  Pregnancy, urine  Result Value Ref Range   Preg Test, Ur NEGATIVE NEGATIVE  Comprehensive metabolic panel  Result Value Ref Range   Sodium 138 135 - 145 mmol/L   Potassium 3.7  3.5 - 5.1 mmol/L   Chloride 106 101 - 111 mmol/L   CO2 25 22 - 32 mmol/L   Glucose, Bld 100 (H) 65 - 99 mg/dL   BUN 10 6 - 20 mg/dL   Creatinine, Ser 8.83 0.44 - 1.00 mg/dL   Calcium 8.4 (L) 8.9 - 10.3 mg/dL   Total Protein 6.6 6.5 - 8.1 g/dL   Albumin 3.8 3.5 - 5.0 g/dL   AST 23 15 - 41 U/L   ALT 16 14 - 54 U/L   Alkaline Phosphatase 34 (L) 38 - 126 U/L   Total Bilirubin 0.5 0.3 - 1.2 mg/dL   GFR calc non Af Amer >60 >60 mL/min   GFR calc Af Amer >60 >60 mL/min   Anion gap 7 5 - 15  Acetaminophen level  Result Value Ref Range   Acetaminophen (Tylenol), Serum <10 (L) 10 - 30 ug/mL  Ethanol  Result Value Ref Range   Alcohol, Ethyl (B) <5 <5 mg/dL  Salicylate level  Result Value  Ref Range   Salicylate Lvl <4.0 2.8 - 30.0 mg/dL  CBC with Differential  Result Value Ref Range   WBC 6.7 4.0 - 10.5 K/uL   RBC 4.49 3.87 - 5.11 MIL/uL   Hemoglobin 14.4 12.0 - 15.0 g/dL   HCT 16.1 09.6 - 04.5 %   MCV 95.3 78.0 - 100.0 fL   MCH 32.1 26.0 - 34.0 pg   MCHC 33.6 30.0 - 36.0 g/dL   RDW 40.9 81.1 - 91.4 %   Platelets 205 150 - 400 K/uL   Neutrophils Relative % 56 43 - 77 %   Neutro Abs 3.8 1.7 - 7.7 K/uL   Lymphocytes Relative 32 12 - 46 %   Lymphs Abs 2.1 0.7 - 4.0 K/uL   Monocytes Relative 7 3 - 12 %   Monocytes Absolute 0.4 0.1 - 1.0 K/uL   Eosinophils Relative 4 0 - 5 %   Eosinophils Absolute 0.3 0.0 - 0.7 K/uL   Basophils Relative 1 0 - 1 %   Basophils Absolute 0.0 0.0 - 0.1 K/uL  Urine rapid drug screen (hosp performed)  Result Value Ref Range   Opiates NONE DETECTED NONE DETECTED   Cocaine NONE DETECTED NONE DETECTED   Benzodiazepines NONE DETECTED NONE DETECTED   Amphetamines NONE DETECTED NONE DETECTED   Tetrahydrocannabinol NONE DETECTED NONE DETECTED   Barbiturates NONE DETECTED NONE DETECTED   Dg Chest 2 View 02/25/2015   CLINICAL DATA:  Altered mental status  EXAM: CHEST  2 VIEW  COMPARISON:  None.  FINDINGS: Normal heart size and mediastinal contours. No acute  infiltrate or edema. No effusion or pneumothorax. No acute osseous findings.  IMPRESSION: Negative chest   Electronically Signed   By: Marnee Spring M.D.   On: 02/25/2015 11:39   Ct Head Wo Contrast 02/25/2015   CLINICAL DATA:  Dizziness and dysarthria  EXAM: CT HEAD WITHOUT CONTRAST  TECHNIQUE: Contiguous axial images were obtained from the base of the skull through the vertex without intravenous contrast.  COMPARISON:  None.  FINDINGS: The ventricles are normal in size and configuration. There is no intracranial mass, hemorrhage, extra-axial fluid collection, or midline shift. Gray-white compartments are normal. No acute infarct evident. Bony calvarium appears intact. The mastoid air cells are clear.  IMPRESSION: Study within normal limits.   Electronically Signed   By: Bretta Bang III M.D.   On: 02/25/2015 11:31   Mr Brain Wo Contrast (neuro Protocol) 02/25/2015   CLINICAL DATA:  Altered mental status and weakness for 1 day. Dizziness.  EXAM: MRI HEAD WITHOUT CONTRAST  TECHNIQUE: Multiplanar, multiecho pulse sequences of the brain and surrounding structures were obtained without intravenous contrast.  COMPARISON:  CT head earlier today.  FINDINGS: No evidence for acute infarction, hemorrhage, mass lesion, hydrocephalus, or extra-axial fluid. Normal cerebral volume. No white matter disease. Flow voids are maintained throughout the carotid, basilar, and vertebral arteries. There are no areas of chronic hemorrhage. Mild tonsillar ectopia. No Chiari I malformation. Normal pituitary. Visualized calvarium, skull base, and upper cervical osseous structures unremarkable. Scalp and extracranial soft tissues, orbits, sinuses, and mastoids show no acute process.  IMPRESSION: Negative exam.   Electronically Signed   By: Elsie Stain M.D.   On: 02/25/2015 12:59    1420:  Pt is not orthostatic on VS. Pt has tol PO well without N/V. Pt has ambulated with steady gait, easy resps, NAD. Pt has been A&O and  appropriate during her ED visit today. No clear cause for pt's "confusion."  No clear indication for admission at this time. Pt states she wants to go home now. Dx and testing d/w pt.  Questions answered.  Verb understanding, agreeable to d/c home with outpt f/u.   Samuel Jester, DO 02/28/15 1811

## 2015-02-25 NOTE — ED Notes (Signed)
Patient states that on drive in she had some confusion with direction.

## 2015-02-25 NOTE — ED Notes (Addendum)
Patient transported to MRI 

## 2015-02-25 NOTE — ED Notes (Signed)
Pt reports since yesterday has felt confused and says " I sound drunk when i talk."  Pt says the symptoms are coming and going.  Denies any change in medications recently, denies missing any doses of medication.  Denies any trauma.  Pt says feels dizzy and nauseated.  Reports vomited last night.  Pt says right now she just feels "foggy."

## 2015-06-10 DIAGNOSIS — F419 Anxiety disorder, unspecified: Secondary | ICD-10-CM | POA: Insufficient documentation

## 2017-06-22 DIAGNOSIS — G43909 Migraine, unspecified, not intractable, without status migrainosus: Secondary | ICD-10-CM | POA: Insufficient documentation

## 2019-11-14 DIAGNOSIS — F410 Panic disorder [episodic paroxysmal anxiety] without agoraphobia: Secondary | ICD-10-CM | POA: Diagnosis not present

## 2019-11-14 DIAGNOSIS — F331 Major depressive disorder, recurrent, moderate: Secondary | ICD-10-CM | POA: Diagnosis not present

## 2019-11-14 DIAGNOSIS — F4312 Post-traumatic stress disorder, chronic: Secondary | ICD-10-CM | POA: Diagnosis not present

## 2019-11-19 DIAGNOSIS — F410 Panic disorder [episodic paroxysmal anxiety] without agoraphobia: Secondary | ICD-10-CM | POA: Diagnosis not present

## 2019-11-19 DIAGNOSIS — F331 Major depressive disorder, recurrent, moderate: Secondary | ICD-10-CM | POA: Diagnosis not present

## 2019-11-19 DIAGNOSIS — F4312 Post-traumatic stress disorder, chronic: Secondary | ICD-10-CM | POA: Diagnosis not present

## 2020-02-11 DIAGNOSIS — F410 Panic disorder [episodic paroxysmal anxiety] without agoraphobia: Secondary | ICD-10-CM | POA: Diagnosis not present

## 2020-02-11 DIAGNOSIS — F4312 Post-traumatic stress disorder, chronic: Secondary | ICD-10-CM | POA: Diagnosis not present

## 2020-02-11 DIAGNOSIS — F331 Major depressive disorder, recurrent, moderate: Secondary | ICD-10-CM | POA: Diagnosis not present

## 2020-02-27 DIAGNOSIS — F445 Conversion disorder with seizures or convulsions: Secondary | ICD-10-CM | POA: Insufficient documentation

## 2020-02-27 DIAGNOSIS — E538 Deficiency of other specified B group vitamins: Secondary | ICD-10-CM | POA: Diagnosis not present

## 2020-02-29 DIAGNOSIS — E611 Iron deficiency: Secondary | ICD-10-CM | POA: Diagnosis not present

## 2020-02-29 DIAGNOSIS — E538 Deficiency of other specified B group vitamins: Secondary | ICD-10-CM | POA: Diagnosis not present

## 2020-02-29 DIAGNOSIS — E559 Vitamin D deficiency, unspecified: Secondary | ICD-10-CM | POA: Diagnosis not present

## 2020-02-29 DIAGNOSIS — E519 Thiamine deficiency, unspecified: Secondary | ICD-10-CM | POA: Diagnosis not present

## 2020-02-29 DIAGNOSIS — E612 Magnesium deficiency: Secondary | ICD-10-CM | POA: Diagnosis not present

## 2020-03-02 DIAGNOSIS — R569 Unspecified convulsions: Secondary | ICD-10-CM | POA: Diagnosis not present

## 2020-03-11 DIAGNOSIS — F410 Panic disorder [episodic paroxysmal anxiety] without agoraphobia: Secondary | ICD-10-CM | POA: Diagnosis not present

## 2020-03-11 DIAGNOSIS — F331 Major depressive disorder, recurrent, moderate: Secondary | ICD-10-CM | POA: Diagnosis not present

## 2020-03-11 DIAGNOSIS — F4312 Post-traumatic stress disorder, chronic: Secondary | ICD-10-CM | POA: Diagnosis not present

## 2020-03-12 ENCOUNTER — Other Ambulatory Visit: Payer: Self-pay

## 2020-03-12 ENCOUNTER — Encounter: Payer: Self-pay | Admitting: Oncology

## 2020-03-14 ENCOUNTER — Inpatient Hospital Stay: Payer: BC Managed Care – PPO

## 2020-03-14 ENCOUNTER — Other Ambulatory Visit: Payer: Self-pay

## 2020-03-14 ENCOUNTER — Inpatient Hospital Stay: Payer: BC Managed Care – PPO | Attending: Oncology | Admitting: Oncology

## 2020-03-14 ENCOUNTER — Encounter: Payer: Self-pay | Admitting: Oncology

## 2020-03-14 VITALS — BP 115/82 | HR 69 | Temp 96.9°F | Resp 16 | Wt 127.4 lb

## 2020-03-14 DIAGNOSIS — E611 Iron deficiency: Secondary | ICD-10-CM | POA: Diagnosis not present

## 2020-03-14 DIAGNOSIS — R251 Tremor, unspecified: Secondary | ICD-10-CM | POA: Insufficient documentation

## 2020-03-14 DIAGNOSIS — Z720 Tobacco use: Secondary | ICD-10-CM

## 2020-03-14 DIAGNOSIS — F418 Other specified anxiety disorders: Secondary | ICD-10-CM | POA: Diagnosis not present

## 2020-03-14 DIAGNOSIS — F17210 Nicotine dependence, cigarettes, uncomplicated: Secondary | ICD-10-CM | POA: Insufficient documentation

## 2020-03-14 DIAGNOSIS — R569 Unspecified convulsions: Secondary | ICD-10-CM | POA: Diagnosis not present

## 2020-03-14 DIAGNOSIS — Z803 Family history of malignant neoplasm of breast: Secondary | ICD-10-CM | POA: Diagnosis not present

## 2020-03-14 DIAGNOSIS — Z79899 Other long term (current) drug therapy: Secondary | ICD-10-CM | POA: Diagnosis not present

## 2020-03-14 DIAGNOSIS — R790 Abnormal level of blood mineral: Secondary | ICD-10-CM

## 2020-03-14 DIAGNOSIS — R5383 Other fatigue: Secondary | ICD-10-CM | POA: Diagnosis not present

## 2020-03-14 MED ORDER — VITRON-C 65-125 MG PO TABS: 1.0000 | ORAL_TABLET | Freq: Every day | ORAL | 0 refills | Status: DC

## 2020-03-14 NOTE — Progress Notes (Signed)
New anemia evaluation.

## 2020-03-15 ENCOUNTER — Encounter: Payer: Self-pay | Admitting: Oncology

## 2020-03-15 NOTE — Progress Notes (Signed)
Hematology/Oncology Consult note [facility] [phone] [phone]   Patient Care Team: [facility] as PCP - General  REFERRING PROVIDER: Naoma Diener, NP CHIEF COMPLAINTS/REASON FOR VISIT:  Evaluation of iron deficiency anemia  HISTORY OF PRESENTING ILLNESS:  Deanna Wong is a  29 y.o.  female with PMH listed below was seen in consultation at the request of Naoma Diener, NP   for evaluation of iron deficiency anemia.   Reviewed patient's recent labs  02/29/2020 labs revealed anemia with hemoglobin of 14.5, MCV 96.4. Normal differential. Ferritin level 8.  Associated signs and symptoms: Patient reports fatigue. Denies SOB with exertion.  Denies weight loss, easy bruising, hematochezia, hemoptysis, hematuria. Context:  Patient follows up with neurology for seizure like activities patient states about 5 years ago she saw neurology for seizures. Recently she had two different types of seizures. Last feeling from her waist down. She was not able to walk. Episode lasted about 15 minutes. Denies any urinary or fecal incontinence during the event. She had another episode with leg shaking and muscle tensed up. Patient is recommended to take EEG. Ferritin was found to be low as part of seizure-like activity work-up. Rectal bleeding: Denies Menstrual bleeding/ Vaginal bleeding : Heavy menstrual bleeding, regular, usually last 6 days. Hematemesis or hemoptysis : denies Blood in urine : denies      Review of Systems  Constitutional: Negative for appetite change, chills, fatigue and fever.  HENT:   Negative for hearing loss and voice change.   Eyes: Negative for eye problems.  Respiratory: Negative for chest tightness and cough.   Cardiovascular: Negative for chest pain.  Gastrointestinal: Negative for abdominal distention, abdominal pain and blood in stool.  Endocrine: Negative for hot flashes.    Genitourinary: Negative for difficulty urinating and frequency.   Musculoskeletal: Negative for arthralgias.  Skin: Negative for itching and rash.  Neurological: Negative for extremity weakness.       Seizure-like activity  Hematological: Negative for adenopathy.  Psychiatric/Behavioral: Negative for confusion.    MEDICAL HISTORY:  Past Medical History:  Diagnosis Date  . Anxiety   . Depression   . Ovarian cyst   . Seizures (HCC)     SURGICAL HISTORY: Past Surgical History:  Procedure Laterality Date  . CESAREAN SECTION    . tubes in ears      SOCIAL HISTORY: Social History   Socioeconomic History  . Marital status: Married    Spouse name: Not on file  . Number of children: Not on file  . Years of education: Not on file  . Highest education level: Not on file  Occupational History  . Not on file  Tobacco Use  . Smoking status: Current Every Day Smoker    Packs/day: 0.50    Years: 10.00    Pack years: 5.00    Types: Cigarettes  Substance and Sexual Activity  . Alcohol use: Yes    Comment: occ  . Drug use: No  . Sexual activity: Not on file  Other Topics Concern  . Not on file  Social History Narrative  . Not on file   Social Determinants of Health   Financial Resource Strain:   . Difficulty of Paying Living Expenses:   Food Insecurity:   . Worried About Programme researcher, broadcasting/film/video in the Last Year:   . Barista in the Last Year:   Transportation Needs:   . Freight forwarder (Medical):   Marland Kitchen  Lack of Transportation (Non-Medical):   Physical Activity:   . Days of Exercise per Week:   . Minutes of Exercise per Session:   Stress:   . Feeling of Stress :   Social Connections:   . Frequency of Communication with Friends and Family:   . Frequency of Social Gatherings with Friends and Family:   . Attends Religious Services:   . Active Member of Clubs or Organizations:   . Attends Banker Meetings:   Marland Kitchen Marital Status:   Intimate Partner  Violence:   . Fear of Current or Ex-Partner:   . Emotionally Abused:   Marland Kitchen Physically Abused:   . Sexually Abused:     FAMILY HISTORY: Family History  Problem Relation Age of Onset  . Stroke Mother   . Breast cancer Maternal Aunt     ALLERGIES:  is allergic to amoxicillin.  MEDICATIONS:  Current Outpatient Medications  Medication Sig Dispense Refill  . clonazePAM (KLONOPIN) 0.5 MG tablet Take by mouth.    Marland Kitchen FLUoxetine (PROZAC) 40 MG capsule Take by mouth.    Marland Kitchen ibuprofen (ADVIL,MOTRIN) 200 MG tablet Take 200 mg by mouth every 6 (six) hours as needed for cramping.    Melene Muller ON 04/03/2020] lamoTRIgine (LAMICTAL) 25 MG tablet Week 1:  1 tablet at night. Week 2:  1 tablet in the morning and 1 tablet at night. Week 3:  1 tablet in the morning and 2 tablets at night. Week 4:  2 tablets in the morning and 2 tablets at night. Week 5:  2 tablets in the morning and 3 tablets at night. Week 6:  3 tablets in the morning and 3 tablets at night. Week 7:  3 tablets in the morning and 4 tablets at night. Week 8:  4 tablets in the morning and 4 tablets at night. And continue this dose.    . propranolol (INDERAL) 10 MG tablet Take 10 mg by mouth 2 (two) times daily.    Marland Kitchen topiramate (TOPAMAX) 100 MG tablet Take 200 mg by mouth at bedtime.    Marland Kitchen buPROPion (WELLBUTRIN SR) 150 MG 12 hr tablet Take 150 mg by mouth 2 (two) times daily. (Patient not taking: Reported on 03/14/2020)    . Ibuprofen (MIDOL) 200 MG CAPS Take 400 mg by mouth daily as needed (cramps). (Patient not taking: Reported on 03/14/2020)    . Iron-Vitamin C (VITRON-C) 65-125 MG TABS Take 1 tablet by mouth daily. 90 tablet 0  . venlafaxine (EFFEXOR) 37.5 MG tablet Take 50 mg by mouth 2 (two) times daily with a meal.  (Patient not taking: Reported on 03/14/2020)  0   No current facility-administered medications for this visit.     PHYSICAL EXAMINATION: ECOG PERFORMANCE STATUS: 0 - Asymptomatic Vitals:   03/14/20 1506  BP: 115/82  Pulse: 69    Resp: 16  Temp: (!) 96.9 F (36.1 C)   Filed Weights   03/14/20 1506  Weight: 127 lb 6.4 oz (57.8 kg)    Physical Exam Constitutional:      General: She is not in acute distress. HENT:     Head: Normocephalic and atraumatic.  Eyes:     General: No scleral icterus. Cardiovascular:     Rate and Rhythm: Normal rate and regular rhythm.     Heart sounds: Normal heart sounds.  Pulmonary:     Effort: Pulmonary effort is normal. No respiratory distress.     Breath sounds: No wheezing.     Comments: Decreased breath  sound bilaterally Abdominal:     General: Bowel sounds are normal. There is no distension.     Palpations: Abdomen is soft.  Musculoskeletal:        General: No deformity. Normal range of motion.     Cervical back: Normal range of motion and neck supple.  Skin:    General: Skin is warm and dry.     Findings: No erythema or rash.  Neurological:     Mental Status: She is alert and oriented to person, place, and time. Mental status is at baseline.     Cranial Nerves: No cranial nerve deficit.     Coordination: Coordination normal.  Psychiatric:        Mood and Affect: Mood normal.       CMP Latest Ref Rng & Units 02/25/2015  Glucose 65 - 99 mg/dL 300(T)  BUN 6 - 20 mg/dL 10  Creatinine 6.22 - 6.33 mg/dL 3.54  Sodium 562 - 563 mmol/L 138  Potassium 3.5 - 5.1 mmol/L 3.7  Chloride 101 - 111 mmol/L 106  CO2 22 - 32 mmol/L 25  Calcium 8.9 - 10.3 mg/dL 8.9(H)  Total Protein 6.5 - 8.1 g/dL 6.6  Total Bilirubin 0.3 - 1.2 mg/dL 0.5  Alkaline Phos 38 - 126 U/L 34(L)  AST 15 - 41 U/L 23  ALT 14 - 54 U/L 16   CBC Latest Ref Rng & Units 02/25/2015  WBC 4.0 - 10.5 K/uL 6.7  Hemoglobin 12.0 - 15.0 g/dL 73.4  Hematocrit 36 - 46 % 42.8  Platelets 150 - 400 K/uL 205     LABORATORY DATA:  I have reviewed the data as listed Lab Results  Component Value Date   WBC 6.7 02/25/2015   HGB 14.4 02/25/2015   HCT 42.8 02/25/2015   MCV 95.3 02/25/2015   PLT 205 02/25/2015    No results for input(s): NA, K, CL, CO2, GLUCOSE, BUN, CREATININE, CALCIUM, GFRNONAA, GFRAA, PROT, ALBUMIN, AST, ALT, ALKPHOS, BILITOT, BILIDIR, IBILI in the last 8760 hours. Iron/TIBC/Ferritin/ %Sat No results found for: IRON, TIBC, FERRITIN, IRONPCTSAT   RADIOGRAPHIC STUDIES: I have personally reviewed the radiological images as listed and agreed with the findings in the report. No results found.     ASSESSMENT & PLAN:  1. Low ferritin   2. Tobacco use   3. Seizure-like activity (HCC)     Labs are reviewed and discussed with patient.  Ferritin is decreased, patient appears to have normal hemoglobin. No anemia. I offered to recheck CBC, smear, complete iron panel including iron, TIBC, ferritin. Patient declined. 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 No need for IV iron at this point. Recommend patient to try oral iron supplementation. She agrees with the plan. Send prescription of Vitron-C 1 tablet daily. Follow-up in 3 months to repeat blood work and evaluate treatment response.  Iron deficient is likely due to heavy menstrual bleed. Seizure-like activity, continue follow-up with neurology. Tobacco use, recommend smoke cessation. Orders Placed This Encounter  Procedures  . CBC with Differential/Platelet    Standing Status:   Future    Standing Expiration Date:   03/14/2021  . Comprehensive metabolic panel    Standing Status:   Future    Standing Expiration Date:   03/14/2021  . Ferritin    Standing Status:   Future    Standing Expiration Date:   03/14/2021  . Iron and TIBC    Standing Status:   Future    Standing  Expiration Date:   03/14/2021  . Retic Panel    Standing Status:   Future    Standing Expiration Date:   03/14/2021    All questions were answered. The patient knows to call the clinic with any problems questions or concerns.  Cc Naoma DienerStepp, Ashley R, NP  Return of visit: 3 months Thank you for this kind  referral and the opportunity to participate in the care of this patient. A copy of today's note is routed to referring provider   Rickard PatienceZhou Osric Klopf, MD, PhD Hematology Oncology [facility] 03/15/2020

## 2020-03-17 DIAGNOSIS — R569 Unspecified convulsions: Secondary | ICD-10-CM | POA: Diagnosis not present

## 2020-03-19 DIAGNOSIS — R519 Headache, unspecified: Secondary | ICD-10-CM | POA: Diagnosis not present

## 2020-03-19 DIAGNOSIS — R200 Anesthesia of skin: Secondary | ICD-10-CM | POA: Diagnosis not present

## 2020-03-19 DIAGNOSIS — M6250 Muscle wasting and atrophy, not elsewhere classified, unspecified site: Secondary | ICD-10-CM | POA: Diagnosis not present

## 2020-03-19 DIAGNOSIS — G4089 Other seizures: Secondary | ICD-10-CM | POA: Diagnosis not present

## 2020-03-19 DIAGNOSIS — F439 Reaction to severe stress, unspecified: Secondary | ICD-10-CM | POA: Diagnosis not present

## 2020-03-19 DIAGNOSIS — F419 Anxiety disorder, unspecified: Secondary | ICD-10-CM | POA: Diagnosis not present

## 2020-03-19 DIAGNOSIS — Z7951 Long term (current) use of inhaled steroids: Secondary | ICD-10-CM | POA: Diagnosis not present

## 2020-03-19 DIAGNOSIS — F445 Conversion disorder with seizures or convulsions: Secondary | ICD-10-CM | POA: Diagnosis not present

## 2020-03-19 DIAGNOSIS — R569 Unspecified convulsions: Secondary | ICD-10-CM | POA: Diagnosis not present

## 2020-03-19 DIAGNOSIS — Z9114 Patient's other noncompliance with medication regimen: Secondary | ICD-10-CM | POA: Diagnosis not present

## 2020-03-19 DIAGNOSIS — F329 Major depressive disorder, single episode, unspecified: Secondary | ICD-10-CM | POA: Diagnosis not present

## 2020-03-19 DIAGNOSIS — Z20822 Contact with and (suspected) exposure to covid-19: Secondary | ICD-10-CM | POA: Diagnosis not present

## 2020-03-19 DIAGNOSIS — E876 Hypokalemia: Secondary | ICD-10-CM | POA: Diagnosis not present

## 2020-03-19 DIAGNOSIS — R531 Weakness: Secondary | ICD-10-CM | POA: Diagnosis not present

## 2020-03-19 DIAGNOSIS — Z880 Allergy status to penicillin: Secondary | ICD-10-CM | POA: Diagnosis not present

## 2020-03-19 DIAGNOSIS — Z79899 Other long term (current) drug therapy: Secondary | ICD-10-CM | POA: Diagnosis not present

## 2020-03-19 DIAGNOSIS — G43909 Migraine, unspecified, not intractable, without status migrainosus: Secondary | ICD-10-CM | POA: Diagnosis not present

## 2020-03-19 DIAGNOSIS — F17210 Nicotine dependence, cigarettes, uncomplicated: Secondary | ICD-10-CM | POA: Diagnosis not present

## 2020-03-24 DIAGNOSIS — R569 Unspecified convulsions: Secondary | ICD-10-CM | POA: Diagnosis not present

## 2020-03-25 ENCOUNTER — Other Ambulatory Visit: Payer: Self-pay | Admitting: Acute Care

## 2020-03-25 DIAGNOSIS — G379 Demyelinating disease of central nervous system, unspecified: Secondary | ICD-10-CM

## 2020-03-29 DIAGNOSIS — R569 Unspecified convulsions: Secondary | ICD-10-CM | POA: Diagnosis not present

## 2020-04-08 ENCOUNTER — Ambulatory Visit: Payer: BC Managed Care – PPO

## 2020-04-21 DIAGNOSIS — F331 Major depressive disorder, recurrent, moderate: Secondary | ICD-10-CM | POA: Diagnosis not present

## 2020-04-21 DIAGNOSIS — F4312 Post-traumatic stress disorder, chronic: Secondary | ICD-10-CM | POA: Diagnosis not present

## 2020-04-21 DIAGNOSIS — F410 Panic disorder [episodic paroxysmal anxiety] without agoraphobia: Secondary | ICD-10-CM | POA: Diagnosis not present

## 2020-04-25 DIAGNOSIS — R569 Unspecified convulsions: Secondary | ICD-10-CM | POA: Diagnosis not present

## 2020-04-26 DIAGNOSIS — R569 Unspecified convulsions: Secondary | ICD-10-CM | POA: Diagnosis not present

## 2020-04-27 DIAGNOSIS — R569 Unspecified convulsions: Secondary | ICD-10-CM | POA: Diagnosis not present

## 2020-05-26 DIAGNOSIS — Z79899 Other long term (current) drug therapy: Secondary | ICD-10-CM | POA: Diagnosis not present

## 2020-05-26 DIAGNOSIS — R569 Unspecified convulsions: Secondary | ICD-10-CM | POA: Diagnosis not present

## 2020-06-13 ENCOUNTER — Inpatient Hospital Stay: Payer: BC Managed Care – PPO | Attending: Oncology

## 2020-06-16 ENCOUNTER — Other Ambulatory Visit: Payer: Self-pay

## 2020-06-16 ENCOUNTER — Inpatient Hospital Stay: Payer: BC Managed Care – PPO | Admitting: Oncology

## 2020-06-16 ENCOUNTER — Telehealth: Payer: Self-pay

## 2020-06-16 DIAGNOSIS — F331 Major depressive disorder, recurrent, moderate: Secondary | ICD-10-CM | POA: Diagnosis not present

## 2020-06-16 DIAGNOSIS — F410 Panic disorder [episodic paroxysmal anxiety] without agoraphobia: Secondary | ICD-10-CM | POA: Diagnosis not present

## 2020-06-16 DIAGNOSIS — F4312 Post-traumatic stress disorder, chronic: Secondary | ICD-10-CM | POA: Diagnosis not present

## 2020-06-16 NOTE — Telephone Encounter (Signed)
Deanna Wong, CMA called patient to check in her 06/16/20 MD virtual visit.  Patient told [name] that she forgot about the appt for today.  She would like to cancel the appt for today and does not wish to reschedule.

## 2020-06-16 NOTE — Telephone Encounter (Signed)
Done

## 2020-06-17 NOTE — Progress Notes (Signed)
No show

## 2020-07-18 ENCOUNTER — Ambulatory Visit
Admission: EM | Admit: 2020-07-18 | Discharge: 2020-07-18 | Disposition: A | Discharge status: Home / Self Care | Payer: BC Managed Care – PPO | Attending: Family Medicine | Admitting: Family Medicine

## 2020-07-18 ENCOUNTER — Ambulatory Visit: Payer: Self-pay

## 2020-07-18 ENCOUNTER — Ambulatory Visit (INDEPENDENT_AMBULATORY_CARE_PROVIDER_SITE_OTHER): Payer: BC Managed Care – PPO

## 2020-07-18 ENCOUNTER — Other Ambulatory Visit: Payer: Self-pay

## 2020-07-18 DIAGNOSIS — R051 Acute cough: Secondary | ICD-10-CM

## 2020-07-18 DIAGNOSIS — R0781 Pleurodynia: Secondary | ICD-10-CM | POA: Diagnosis not present

## 2020-07-18 DIAGNOSIS — R059 Cough, unspecified: Secondary | ICD-10-CM | POA: Diagnosis not present

## 2020-07-18 DIAGNOSIS — R5383 Other fatigue: Secondary | ICD-10-CM

## 2020-07-18 DIAGNOSIS — R0602 Shortness of breath: Secondary | ICD-10-CM

## 2020-07-18 DIAGNOSIS — J069 Acute upper respiratory infection, unspecified: Secondary | ICD-10-CM

## 2020-07-18 DIAGNOSIS — R519 Headache, unspecified: Secondary | ICD-10-CM

## 2020-07-18 DIAGNOSIS — R52 Pain, unspecified: Secondary | ICD-10-CM

## 2020-07-18 MED ORDER — PREDNISONE 10 MG (21) PO TBPK
ORAL_TABLET | Freq: Every day | ORAL | 0 refills | Status: AC
Start: 2020-07-18 — End: 2020-07-24

## 2020-07-18 MED ORDER — BENZONATATE 100 MG PO CAPS: 100.0000 mg | ORAL_CAPSULE | Freq: Three times a day (TID) | ORAL | 0 refills | Status: DC

## 2020-07-18 MED ORDER — DOXYCYCLINE HYCLATE 100 MG PO CAPS: 100.0000 mg | ORAL_CAPSULE | Freq: Two times a day (BID) | ORAL | 0 refills | Status: DC

## 2020-07-18 MED ORDER — ALBUTEROL SULFATE HFA 108 (90 BASE) MCG/ACT IN AERS: 2.0000 | INHALATION_SPRAY | RESPIRATORY_TRACT | 0 refills | Status: DC | PRN

## 2020-07-18 NOTE — ED Triage Notes (Signed)
Pt reports having nasal congestion x2 weeks. sts she was treated for sinus infection. Completed 7 day course of abx Saturday and not feeling any better. Began to have chest pressure. Also having a productive cough.

## 2020-07-18 NOTE — ED Provider Notes (Signed)
[facility]   [id number] 07/18/20 Arrival Time: 1455   CC: COVID symptoms  SUBJECTIVE: History from: patient.  Deanna Wong is a 29 y.o. female who presents with nasal congestion, PND, fatigue, headaches, sinus pain and pressure, chest pain and tightness with coughing and deep breathing, and productive cough with dark brown sputum for the 2 weeks. Reports that she finished Augmentin course 6 days ago for sinus infection. States that she thought she was getting better and now feels worse than she did in the beginning of her illness. Denies sick exposure to COVID, flu or strep. Denies recent travel. Has negative history of Covid. Has not completed Covid vaccines. Has not taken OTC medications for this. There are no aggravating or alleviating factors. Denies previous symptoms in the past. Denies fever, sinus pain, rhinorrhea, sore throat, nausea, changes in bowel or bladder habits. Declines Covid testing today.  ROS: As per HPI.  All other pertinent ROS negative.     Past Medical History:  Diagnosis Date  . Anxiety   . Depression   . Ovarian cyst   . Seizures (HCC)    Past Surgical History:  Procedure Laterality Date  . CESAREAN SECTION    . tubes in ears     Allergies  Allergen Reactions  . Amoxicillin Rash   No current facility-administered medications on file prior to encounter.   Current Outpatient Medications on File Prior to Encounter  Medication Sig Dispense Refill  . buPROPion (WELLBUTRIN SR) 150 MG 12 hr tablet Take 150 mg by mouth 2 (two) times daily. (Patient not taking: Reported on 03/14/2020)    . clonazePAM (KLONOPIN) 0.5 MG tablet Take by mouth.    Marland Kitchen FLUoxetine (PROZAC) 40 MG capsule Take by mouth.    Marland Kitchen ibuprofen (ADVIL,MOTRIN) 200 MG tablet Take 200 mg by mouth every 6 (six) hours as needed for cramping.    . Ibuprofen (MIDOL) 200 MG CAPS Take 400 mg by mouth daily as needed (cramps). (Patient not taking: Reported on 03/14/2020)    . Iron-Vitamin C  (VITRON-C) 65-125 MG TABS Take 1 tablet by mouth daily. 90 tablet 0  . lamoTRIgine (LAMICTAL) 25 MG tablet Week 1:  1 tablet at night. Week 2:  1 tablet in the morning and 1 tablet at night. Week 3:  1 tablet in the morning and 2 tablets at night. Week 4:  2 tablets in the morning and 2 tablets at night. Week 5:  2 tablets in the morning and 3 tablets at night. Week 6:  3 tablets in the morning and 3 tablets at night. Week 7:  3 tablets in the morning and 4 tablets at night. Week 8:  4 tablets in the morning and 4 tablets at night. And continue this dose.    . propranolol (INDERAL) 10 MG tablet Take 10 mg by mouth 2 (two) times daily.    Marland Kitchen topiramate (TOPAMAX) 100 MG tablet Take 200 mg by mouth at bedtime.    Marland Kitchen venlafaxine (EFFEXOR) 37.5 MG tablet Take 50 mg by mouth 2 (two) times daily with a meal.  (Patient not taking: Reported on 03/14/2020)  0   Social History   Socioeconomic History  . Marital status: Married    Spouse name: Not on file  . Number of children: Not on file  . Years of education: Not on file  . Highest education level: Not on file  Occupational History  . Not on file  Tobacco Use  . Smoking status: Current Every  Day Smoker    Packs/day: 0.50    Years: 10.00    Pack years: 5.00    Types: Cigarettes  . Smokeless tobacco: Never Used  Substance and Sexual Activity  . Alcohol use: Yes    Comment: occ  . Drug use: No  . Sexual activity: Not on file  Other Topics Concern  . Not on file  Social History Narrative  . Not on file   Social Determinants of Health   Financial Resource Strain:   . Difficulty of Paying Living Expenses: Not on file  Food Insecurity:   . Worried About Programme researcher, broadcasting/film/video in the Last Year: Not on file  . Ran Out of Food in the Last Year: Not on file  Transportation Needs:   . Lack of Transportation (Medical): Not on file  . Lack of Transportation (Non-Medical): Not on file  Physical Activity:   . Days of Exercise per Week: Not on file  .  Minutes of Exercise per Session: Not on file  Stress:   . Feeling of Stress : Not on file  Social Connections:   . Frequency of Communication with Friends and Family: Not on file  . Frequency of Social Gatherings with Friends and Family: Not on file  . Attends Religious Services: Not on file  . Active Member of Clubs or Organizations: Not on file  . Attends Banker Meetings: Not on file  . Marital Status: Not on file  Intimate Partner Violence:   . Fear of Current or Ex-Partner: Not on file  . Emotionally Abused: Not on file  . Physically Abused: Not on file  . Sexually Abused: Not on file   Family History  Problem Relation Age of Onset  . Stroke Mother   . Breast cancer Maternal Aunt     OBJECTIVE:  Vitals:   07/18/20 1504  BP: 130/85  Pulse: 84  Resp: 16  Temp: 98.8 F (37.1 C)  TempSrc: Oral  SpO2: 97%  Weight: 135 lb (61.2 kg)  Height: 5\' 5"  (1.651 m)     General appearance: alert; appears fatigued, but nontoxic; speaking in full sentences and tolerating own secretions HEENT: NCAT; Ears: EACs clear, TMs pearly gray; Eyes: PERRL.  EOM grossly intact. Sinuses: nontender; Nose: nares patent without rhinorrhea, Throat: oropharynx erythematous, cobblestoning present, tonsils non erythematous or enlarged, uvula midline  Neck: supple with LAD Lungs: unlabored respirations, symmetrical air entry; cough: mild; no respiratory distress; coarse lung sounds throughout, wheezing to upper lobes Heart: regular rate and rhythm.  Radial pulses 2+ symmetrical bilaterally Skin: warm and dry Psychological: alert and cooperative; normal mood and affect  LABS:  No results found for this or any previous visit (from the past 24 hour(s)).   ASSESSMENT & PLAN:  1. Upper respiratory tract infection, unspecified type   2. Cough   3. SOB (shortness of breath)   4. Pleuritic chest pain   5. Other fatigue   6. Nonintractable headache, unspecified chronicity pattern,  unspecified headache type   7. Body aches     Meds ordered this encounter  Medications  . doxycycline (VIBRAMYCIN) 100 MG capsule    Sig: Take 1 capsule (100 mg total) by mouth 2 (two) times daily.    Dispense:  14 capsule    Refill:  0    Order Specific Question:   Supervising Provider    Answer:   Merrilee Jansky  . predniSONE (STERAPRED UNI-PAK 21 TAB) 10 MG (21) TBPK tablet  Sig: Take by mouth daily for 6 days. Take 6 tablets on day 1, 5 tablets on day 2, 4 tablets on day 3, 3 tablets on day 4, 2 tablets on day 5, 1 tablet on day 6    Dispense:  21 tablet    Refill:  0    Order Specific Question:   Supervising Provider    Answer:   Merrilee Jansky [id number]  . albuterol (VENTOLIN HFA) 108 (90 Base) MCG/ACT inhaler    Sig: Inhale 2 puffs into the lungs every 4 (four) hours as needed for wheezing or shortness of breath.    Dispense:  18 g    Refill:  0    Order Specific Question:   Supervising Provider    Answer:   Merrilee Jansky [id number]  . benzonatate (TESSALON) 100 MG capsule    Sig: Take 1 capsule (100 mg total) by mouth every 8 (eight) hours.    Dispense:  21 capsule    Refill:  0    Order Specific Question:   Supervising Provider    Answer:   Merrilee Jansky [[id number]]   Chest xray today negative Steroid taper prescribed Tessalon perles prescribed Doxycycline prescribed Albuterol prescribed Get plenty of rest and push fluids Use OTC zyrtec for nasal congestion, runny nose, and/or sore throat Use OTC flonase for nasal congestion and runny nose Use medications daily for symptom relief Use OTC medications like ibuprofen or tylenol as needed fever or pain Call or go to the ED if you have any new or worsening symptoms such as fever, worsening cough, shortness of breath, chest tightness, chest pain, turning blue, changes in mental status.  Reviewed expectations re: course of current medical issues. Questions answered. Outlined signs and symptoms  indicating need for more acute intervention. Patient verbalized understanding. After Visit Summary given.         Moshe Cipro, NP 07/18/20 1553

## 2020-07-18 NOTE — Discharge Instructions (Addendum)
Chest xray was negative today  I have sent in an albuterol inhaler for you to use 2 puffs every 4-6 hours as needed for cough, shortness of breath, wheezing.  I have sent in a prednisone taper for you to take for 6 days. 6 tablets on day one, 5 tablets on day two, 4 tablets on day three, 3 tablets on day four, 2 tablets on day five, and 1 tablet on day six.  I have also sent in doxycycline for you to take twice a day for 7 days  Follow up with this office or with primary care if symptoms are persisting.  Follow up in the ER for high fever, trouble swallowing, trouble breathing, other concerning symptoms.

## 2020-09-13 ENCOUNTER — Other Ambulatory Visit: Payer: BC Managed Care – PPO

## 2020-09-13 ENCOUNTER — Other Ambulatory Visit: Payer: Self-pay

## 2020-09-13 DIAGNOSIS — Z20822 Contact with and (suspected) exposure to covid-19: Secondary | ICD-10-CM | POA: Diagnosis not present

## 2020-09-14 ENCOUNTER — Other Ambulatory Visit: Payer: BC Managed Care – PPO

## 2020-09-15 ENCOUNTER — Other Ambulatory Visit: Payer: Self-pay

## 2020-09-15 ENCOUNTER — Ambulatory Visit
Admission: RE | Admit: 2020-09-15 | Discharge: 2020-09-15 | Disposition: A | Discharge status: Home / Self Care | Payer: BC Managed Care – PPO | Source: Ambulatory Visit | Attending: Emergency Medicine | Admitting: Emergency Medicine

## 2020-09-15 ENCOUNTER — Ambulatory Visit (INDEPENDENT_AMBULATORY_CARE_PROVIDER_SITE_OTHER): Payer: BC Managed Care – PPO

## 2020-09-15 VITALS — BP 115/78 | HR 73 | Temp 98.1°F | Resp 16 | Wt 135.0 lb

## 2020-09-15 DIAGNOSIS — R0602 Shortness of breath: Secondary | ICD-10-CM

## 2020-09-15 DIAGNOSIS — B9789 Other viral agents as the cause of diseases classified elsewhere: Secondary | ICD-10-CM

## 2020-09-15 DIAGNOSIS — J988 Other specified respiratory disorders: Secondary | ICD-10-CM | POA: Diagnosis not present

## 2020-09-15 DIAGNOSIS — R509 Fever, unspecified: Secondary | ICD-10-CM

## 2020-09-15 MED ORDER — PREDNISONE 10 MG PO TABS: 20.0000 mg | ORAL_TABLET | Freq: Every day | ORAL | 0 refills | Status: DC

## 2020-09-15 MED ORDER — ALBUTEROL SULFATE HFA 108 (90 BASE) MCG/ACT IN AERS: 1.0000 | INHALATION_SPRAY | Freq: Four times a day (QID) | RESPIRATORY_TRACT | 1 refills | Status: DC | PRN

## 2020-09-15 NOTE — Discharge Instructions (Signed)
Get plenty of rest and push fluids Albuterol was prescribed for shortness of breath Prednisone was prescribed Use medications daily for symptom relief Use OTC medications like ibuprofen or tylenol as needed fever or pain Call or go to the ED if you have any new or worsening symptoms such as fever, worsening cough, shortness of breath, chest tightness, chest pain, turning blue, changes in mental status, etc..Marland Kitchen

## 2020-09-15 NOTE — ED Provider Notes (Signed)
[facility] CARE CENTER   [id number] 09/15/20 Arrival Time: 0840   CC: COVID symptoms  SUBJECTIVE: History from: patient.  Deanna Wong is a 30 y.o. female who presented to the urgent care for complaint of fever, and shortness of breath for the past few days.  Has tested for COVID via PCR on 09/13/2020.  Denies known exposure to COVID, flu or strep.  Denies recent travel.  Has tried OTC medication without relief.  Denies any aggravating factors.  Denies previous symptoms in the past.   Denies fever, chills, fatigue, sinus pain, rhinorrhea, sore throat, wheezing, chest pain, nausea, changes in bowel or bladder habits.      ROS: As per HPI.  All other pertinent ROS negative.      Past Medical History:  Diagnosis Date  . Anxiety   . Depression   . Ovarian cyst   . Seizures (HCC)    Past Surgical History:  Procedure Laterality Date  . CESAREAN SECTION    . tubes in ears     Allergies  Allergen Reactions  . Amoxicillin Rash   No current facility-administered medications on file prior to encounter.   Current Outpatient Medications on File Prior to Encounter  Medication Sig Dispense Refill  . benzonatate (TESSALON) 100 MG capsule Take 1 capsule (100 mg total) by mouth every 8 (eight) hours. 21 capsule 0  . buPROPion (WELLBUTRIN SR) 150 MG 12 hr tablet Take 150 mg by mouth 2 (two) times daily. (Patient not taking: No sig reported)    . clonazePAM (KLONOPIN) 0.5 MG tablet Take by mouth.    . doxycycline (VIBRAMYCIN) 100 MG capsule Take 1 capsule (100 mg total) by mouth 2 (two) times daily. 14 capsule 0  . FLUoxetine (PROZAC) 40 MG capsule Take by mouth.    Marland Kitchen ibuprofen (ADVIL,MOTRIN) 200 MG tablet Take 200 mg by mouth every 6 (six) hours as needed for cramping.    . Ibuprofen (MIDOL) 200 MG CAPS Take 400 mg by mouth daily as needed (cramps). (Patient not taking: Reported on 03/14/2020)    . Iron-Vitamin C (VITRON-C) 65-125 MG TABS Take 1 tablet by mouth daily. 90 tablet 0  .  lamoTRIgine (LAMICTAL) 25 MG tablet Week 1:  1 tablet at night. Week 2:  1 tablet in the morning and 1 tablet at night. Week 3:  1 tablet in the morning and 2 tablets at night. Week 4:  2 tablets in the morning and 2 tablets at night. Week 5:  2 tablets in the morning and 3 tablets at night. Week 6:  3 tablets in the morning and 3 tablets at night. Week 7:  3 tablets in the morning and 4 tablets at night. Week 8:  4 tablets in the morning and 4 tablets at night. And continue this dose.    . propranolol (INDERAL) 10 MG tablet Take 10 mg by mouth 2 (two) times daily.    Marland Kitchen topiramate (TOPAMAX) 100 MG tablet Take 200 mg by mouth at bedtime.    Marland Kitchen venlafaxine (EFFEXOR) 37.5 MG tablet Take 50 mg by mouth 2 (two) times daily with a meal.  (Patient not taking: No sig reported)  0   Social History   Socioeconomic History  . Marital status: Married    Spouse name: Not on file  . Number of children: Not on file  . Years of education: Not on file  . Highest education level: Not on file  Occupational History  . Not on file  Tobacco Use  .  Smoking status: Current Every Day Smoker    Packs/day: 0.50    Years: 10.00    Pack years: 5.00    Types: Cigarettes  . Smokeless tobacco: Never Used  Substance and Sexual Activity  . Alcohol use: Yes    Comment: occ  . Drug use: No  . Sexual activity: Not on file  Other Topics Concern  . Not on file  Social History Narrative  . Not on file   Social Determinants of Health   Financial Resource Strain: Not on file  Food Insecurity: Not on file  Transportation Needs: Not on file  Physical Activity: Not on file  Stress: Not on file  Social Connections: Not on file  Intimate Partner Violence: Not on file   Family History  Problem Relation Age of Onset  . Stroke Mother   . Breast cancer Maternal Aunt     OBJECTIVE:  Vitals:   09/15/20 0847 09/15/20 0853  BP:  115/78  Pulse:  73  Resp:  16  Temp:  98.1 F (36.7 C)  TempSrc:  Oral  SpO2:  98%   Weight: 135 lb (61.2 kg)      General appearance: alert; appears fatigued, but nontoxic; speaking in full sentences and tolerating own secretions HEENT: NCAT; Ears: EACs clear, TMs pearly gray; Eyes: PERRL.  EOM grossly intact. Sinuses: nontender; Nose: nares patent without rhinorrhea, Throat: oropharynx clear, tonsils non erythematous or enlarged, uvula midline  Neck: supple without LAD Lungs: unlabored respirations, symmetrical air entry; cough: moderate; no respiratory distress; CTAB Heart: regular rate and rhythm.  Radial pulses 2+ symmetrical bilaterally Skin: warm and dry Psychological: alert and cooperative; normal mood and affect  LABS:  No results found for this or any previous visit (from the past 24 hour(s)).   RADIOLOGY:  DG Chest 2 View  Result Date: 09/15/2020 CLINICAL DATA:  Shortness of breath and fever for 3 days EXAM: CHEST - 2 VIEW COMPARISON:  07/18/2020 FINDINGS: The heart size and mediastinal contours are within normal limits. Subtle interstitial opacity within the periphery of the left upper lobe. Elsewhere, lungs appear clear. No pleural effusion or pneumothorax. The visualized skeletal structures are unremarkable. IMPRESSION: Subtle interstitial opacity within the periphery of the left upper lobe, suspicious for developing atypical/viral infection. Electronically Signed   By: Duanne Guess D.O.   On: 09/15/2020 09:21   Chest X-ray is positive for atypical/ viral infection.  I have reviewed the x-ray myself and the radiologist interpretation.  I am in agreement with the radiologist interpretation.  ASSESSMENT & PLAN:  1. SOB (shortness of breath)   2. Viral respiratory infection     Meds ordered this encounter  Medications  . albuterol (VENTOLIN HFA) 108 (90 Base) MCG/ACT inhaler    Sig: Inhale 1-2 puffs into the lungs every 6 (six) hours as needed for wheezing or shortness of breath.    Dispense:  18 g    Refill:  1  . predniSONE (DELTASONE) 10 MG  tablet    Sig: Take 2 tablets (20 mg total) by mouth daily.    Dispense:  15 tablet    Refill:  0    Discharge Instructions  Get plenty of rest and push fluids Albuterol was prescribed for shortness of breath Prednisone was prescribed Use medications daily for symptom relief Use OTC medications like ibuprofen or tylenol as needed fever or pain Call or go to the ED if you have any new or worsening symptoms such as fever, worsening cough, shortness of  breath, chest tightness, chest pain, turning blue, changes in mental status, etc...   Reviewed expectations re: course of current medical issues. Questions answered. Outlined signs and symptoms indicating need for more acute intervention. Patient verbalized understanding. After Visit Summary given.         Durward Parcel, FNP 09/15/20 [phone]

## 2020-09-15 NOTE — ED Triage Notes (Signed)
Patient presents to Urgent Care with complaints of fever (102.0), chills, sob (93%), and chest discomfort with breathing since Friday. Pt reports had a rapid  Negative covid test. Treating symptoms with Tylenol and albuterol. Pt states no relief with the albuterol.  Provider instructed pt to come get a chest x-ray. Pt had a covid test at [facility] waiting on results.

## 2020-09-16 LAB — NOVEL CORONAVIRUS, NAA: SARS-CoV-2, NAA: NOT DETECTED

## 2020-10-03 DIAGNOSIS — F331 Major depressive disorder, recurrent, moderate: Secondary | ICD-10-CM | POA: Diagnosis not present

## 2020-10-03 DIAGNOSIS — F4312 Post-traumatic stress disorder, chronic: Secondary | ICD-10-CM | POA: Diagnosis not present

## 2020-10-03 DIAGNOSIS — F410 Panic disorder [episodic paroxysmal anxiety] without agoraphobia: Secondary | ICD-10-CM | POA: Diagnosis not present

## 2020-11-28 DIAGNOSIS — F331 Major depressive disorder, recurrent, moderate: Secondary | ICD-10-CM | POA: Diagnosis not present

## 2020-11-28 DIAGNOSIS — F4312 Post-traumatic stress disorder, chronic: Secondary | ICD-10-CM | POA: Diagnosis not present

## 2020-11-28 DIAGNOSIS — F410 Panic disorder [episodic paroxysmal anxiety] without agoraphobia: Secondary | ICD-10-CM | POA: Diagnosis not present

## 2021-04-08 DIAGNOSIS — F4312 Post-traumatic stress disorder, chronic: Secondary | ICD-10-CM | POA: Diagnosis not present

## 2021-04-08 DIAGNOSIS — F410 Panic disorder [episodic paroxysmal anxiety] without agoraphobia: Secondary | ICD-10-CM | POA: Diagnosis not present

## 2021-04-08 DIAGNOSIS — F331 Major depressive disorder, recurrent, moderate: Secondary | ICD-10-CM | POA: Diagnosis not present

## 2021-05-06 DIAGNOSIS — F4312 Post-traumatic stress disorder, chronic: Secondary | ICD-10-CM | POA: Diagnosis not present

## 2021-05-06 DIAGNOSIS — F410 Panic disorder [episodic paroxysmal anxiety] without agoraphobia: Secondary | ICD-10-CM | POA: Diagnosis not present

## 2021-05-06 DIAGNOSIS — F331 Major depressive disorder, recurrent, moderate: Secondary | ICD-10-CM | POA: Diagnosis not present

## 2021-06-05 DIAGNOSIS — F4312 Post-traumatic stress disorder, chronic: Secondary | ICD-10-CM | POA: Diagnosis not present

## 2021-06-05 DIAGNOSIS — F331 Major depressive disorder, recurrent, moderate: Secondary | ICD-10-CM | POA: Diagnosis not present

## 2021-06-05 DIAGNOSIS — F410 Panic disorder [episodic paroxysmal anxiety] without agoraphobia: Secondary | ICD-10-CM | POA: Diagnosis not present

## 2021-07-03 DIAGNOSIS — F4312 Post-traumatic stress disorder, chronic: Secondary | ICD-10-CM | POA: Diagnosis not present

## 2021-07-03 DIAGNOSIS — F410 Panic disorder [episodic paroxysmal anxiety] without agoraphobia: Secondary | ICD-10-CM | POA: Diagnosis not present

## 2021-07-03 DIAGNOSIS — F331 Major depressive disorder, recurrent, moderate: Secondary | ICD-10-CM | POA: Diagnosis not present

## 2021-07-24 DIAGNOSIS — F410 Panic disorder [episodic paroxysmal anxiety] without agoraphobia: Secondary | ICD-10-CM | POA: Diagnosis not present

## 2021-07-24 DIAGNOSIS — F4312 Post-traumatic stress disorder, chronic: Secondary | ICD-10-CM | POA: Diagnosis not present

## 2021-07-24 DIAGNOSIS — F331 Major depressive disorder, recurrent, moderate: Secondary | ICD-10-CM | POA: Diagnosis not present

## 2021-08-03 DIAGNOSIS — F410 Panic disorder [episodic paroxysmal anxiety] without agoraphobia: Secondary | ICD-10-CM | POA: Diagnosis not present

## 2021-08-03 DIAGNOSIS — F4312 Post-traumatic stress disorder, chronic: Secondary | ICD-10-CM | POA: Diagnosis not present

## 2021-08-03 DIAGNOSIS — F331 Major depressive disorder, recurrent, moderate: Secondary | ICD-10-CM | POA: Diagnosis not present

## 2021-10-08 ENCOUNTER — Other Ambulatory Visit: Payer: Self-pay

## 2021-10-08 ENCOUNTER — Ambulatory Visit
Admission: RE | Admit: 2021-10-08 | Discharge: 2021-10-08 | Discharge status: Against Medical Advice | Payer: BC Managed Care – PPO | Source: Ambulatory Visit | Attending: General Practice | Admitting: General Practice

## 2021-10-08 DIAGNOSIS — R071 Chest pain on breathing: Secondary | ICD-10-CM | POA: Diagnosis not present

## 2021-10-08 DIAGNOSIS — R079 Chest pain, unspecified: Secondary | ICD-10-CM | POA: Diagnosis not present

## 2021-10-08 DIAGNOSIS — R0602 Shortness of breath: Secondary | ICD-10-CM | POA: Diagnosis not present

## 2021-10-09 ENCOUNTER — Emergency Department (HOSPITAL_COMMUNITY): Payer: BC Managed Care – PPO

## 2021-10-09 ENCOUNTER — Emergency Department (HOSPITAL_COMMUNITY)
Admission: EM | Admit: 2021-10-09 | Discharge: 2021-10-09 | Disposition: A | Discharge status: Home / Self Care | Payer: BC Managed Care – PPO | Attending: Emergency Medicine | Admitting: Emergency Medicine

## 2021-10-09 ENCOUNTER — Encounter (HOSPITAL_COMMUNITY): Payer: Self-pay | Admitting: Emergency Medicine

## 2021-10-09 ENCOUNTER — Other Ambulatory Visit: Payer: Self-pay

## 2021-10-09 DIAGNOSIS — K209 Esophagitis, unspecified without bleeding: Secondary | ICD-10-CM | POA: Diagnosis not present

## 2021-10-09 DIAGNOSIS — R079 Chest pain, unspecified: Secondary | ICD-10-CM | POA: Diagnosis not present

## 2021-10-09 DIAGNOSIS — R0602 Shortness of breath: Secondary | ICD-10-CM | POA: Diagnosis not present

## 2021-10-09 DIAGNOSIS — R0789 Other chest pain: Secondary | ICD-10-CM

## 2021-10-09 LAB — COMPREHENSIVE METABOLIC PANEL
ALT: 18 U/L (ref 0–44)
AST: 23 U/L (ref 15–41)
Albumin: 3.4 g/dL — ABNORMAL LOW (ref 3.5–5.0)
Alkaline Phosphatase: 46 U/L (ref 38–126)
Anion gap: 9 (ref 5–15)
BUN: 9 mg/dL (ref 6–20)
CO2: 19 mmol/L — ABNORMAL LOW (ref 22–32)
Calcium: 8.6 mg/dL — ABNORMAL LOW (ref 8.9–10.3)
Chloride: 111 mmol/L (ref 98–111)
Creatinine, Ser: 0.98 mg/dL (ref 0.44–1.00)
GFR, Estimated: >60 mL/min (ref >60)
Glucose, Bld: 100 mg/dL — ABNORMAL HIGH (ref 70–99)
Potassium: 3.2 mmol/L — ABNORMAL LOW (ref 3.5–5.1)
Sodium: 139 mmol/L (ref 135–145)
Total Bilirubin: 0.6 mg/dL (ref 0.3–1.2)
Total Protein: 6.6 g/dL (ref 6.5–8.1)

## 2021-10-09 LAB — CBC WITH DIFFERENTIAL/PLATELET
Abs Immature Granulocytes: 0.05 10*3/uL (ref 0.00–0.07)
Basophils Absolute: 0.1 10*3/uL (ref 0.0–0.1)
Basophils Relative: 1 %
Eosinophils Absolute: 0.4 10*3/uL (ref 0.0–0.5)
Eosinophils Relative: 3 %
HCT: 40.5 % (ref 36.0–46.0)
Hemoglobin: 13.9 g/dL (ref 12.0–15.0)
Immature Granulocytes: 0 %
Lymphocytes Relative: 14 %
Lymphs Abs: 2 10*3/uL (ref 0.7–4.0)
MCH: 31.2 pg (ref 26.0–34.0)
MCHC: 34.3 g/dL (ref 30.0–36.0)
MCV: 90.8 fL (ref 80.0–100.0)
Monocytes Absolute: 0.7 10*3/uL (ref 0.1–1.0)
Monocytes Relative: 5 %
Neutro Abs: 11.1 10*3/uL — ABNORMAL HIGH (ref 1.7–7.7)
Neutrophils Relative %: 77 %
Platelets: 231 10*3/uL (ref 150–400)
RBC: 4.46 MIL/uL (ref 3.87–5.11)
RDW: 14.4 % (ref 11.5–15.5)
WBC: 14.3 10*3/uL — ABNORMAL HIGH (ref 4.0–10.5)
nRBC: 0 % (ref 0.0–0.2)

## 2021-10-09 LAB — I-STAT BETA HCG BLOOD, ED (MC, WL, AP ONLY): I-stat hCG, quantitative: 7.6 m[IU]/mL — ABNORMAL HIGH (ref <5)

## 2021-10-09 LAB — TROPONIN I (HIGH SENSITIVITY)
Troponin I (High Sensitivity): 2 ng/L (ref <18)
Troponin I (High Sensitivity): <2 ng/L (ref <18)

## 2021-10-09 LAB — I-STAT CHEM 8, ED
BUN: 7 mg/dL (ref 6–20)
Calcium, Ion: 1.23 mmol/L (ref 1.15–1.40)
Chloride: 110 mmol/L (ref 98–111)
Creatinine, Ser: 0.8 mg/dL (ref 0.44–1.00)
Glucose, Bld: 75 mg/dL (ref 70–99)
HCT: 38 % (ref 36.0–46.0)
Hemoglobin: 12.9 g/dL (ref 12.0–15.0)
Potassium: 3.5 mmol/L (ref 3.5–5.1)
Sodium: 142 mmol/L (ref 135–145)
TCO2: 20 mmol/L — ABNORMAL LOW (ref 22–32)

## 2021-10-09 LAB — HCG, QUANTITATIVE, PREGNANCY: hCG, Beta Chain, Quant, S: <1 m[IU]/mL (ref <5)

## 2021-10-09 LAB — D-DIMER, QUANTITATIVE: D-Dimer, Quant: 0.64 ug/mL-FEU — ABNORMAL HIGH (ref 0.00–0.50)

## 2021-10-09 MED ORDER — OMEPRAZOLE 20 MG PO CPDR: 20.0000 mg | DELAYED_RELEASE_CAPSULE | Freq: Every day | ORAL | 1 refills | Status: DC

## 2021-10-09 MED ORDER — PANTOPRAZOLE SODIUM 40 MG IV SOLR
40.0000 mg | Freq: Once | INTRAVENOUS | Status: AC
  Administered 2021-10-09: 40 mg via INTRAVENOUS
  Filled 2021-10-09: qty 40

## 2021-10-09 MED ORDER — IOHEXOL 350 MG/ML SOLN
75.0000 mL | Freq: Once | INTRAVENOUS | Status: AC | PRN
  Administered 2021-10-09: 75 mL via INTRAVENOUS

## 2021-10-09 NOTE — ED Provider Notes (Addendum)
[facility] EMERGENCY DEPARTMENT Provider Note   CSN: [id number] Arrival date & time: 10/09/21  [id number]     History  Chief Complaint  Patient presents with   Chest Pain    Deanna Wong is a 31 y.o. female.  Patient with complaint of substernal chest pain more the upper part of the chest for 3 days.  Started midday on Wednesday.  Pain has been constant.  Associated also with kind of a burning sensation in the throat area.  Patient apparently at urgent care yesterday and they recommended that she come in to rule out pulmonary embolus.  Patient denies any leg swelling.  Past medical history significant for ovarian cyst depression anxiety and a history of seizures.  However patient is not on any antiseizure medication.      Home Medications Prior to Admission medications   Medication Sig Start Date End Date Taking? Authorizing Provider  albuterol (VENTOLIN HFA) 108 (90 Base) MCG/ACT inhaler Inhale 1-2 puffs into the lungs every 6 (six) hours as needed for wheezing or shortness of breath. 09/15/20   Avegno, Darrelyn Hillock, FNP  benzonatate (TESSALON) 100 MG capsule Take 1 capsule (100 mg total) by mouth every 8 (eight) hours. 07/18/20   Faustino Congress, NP  buPROPion [facility] SR) 150 MG 12 hr tablet Take 150 mg by mouth 2 (two) times daily. Patient not taking: No sig reported    [provider]  clonazePAM (KLONOPIN) 0.5 MG tablet Take by mouth.    [provider]  doxycycline (VIBRAMYCIN) 100 MG capsule Take 1 capsule (100 mg total) by mouth 2 (two) times daily. 07/18/20   Faustino Congress, NP  FLUoxetine (PROZAC) 40 MG capsule Take by mouth.    [provider]  ibuprofen (ADVIL,MOTRIN) 200 MG tablet Take 200 mg by mouth every 6 (six) hours as needed for cramping.    [provider]  Ibuprofen (MIDOL) 200 MG CAPS Take 400 mg by mouth daily as needed (cramps). Patient not taking: Reported on 03/14/2020    [provider]  Iron-Vitamin C  (VITRON-C) 65-125 MG TABS Take 1 tablet by mouth daily. 03/14/20   Earlie Server, MD  lamoTRIgine (LAMICTAL) 25 MG tablet Week 1:  1 tablet at night. Week 2:  1 tablet in the morning and 1 tablet at night. Week 3:  1 tablet in the morning and 2 tablets at night. Week 4:  2 tablets in the morning and 2 tablets at night. Week 5:  2 tablets in the morning and 3 tablets at night. Week 6:  3 tablets in the morning and 3 tablets at night. Week 7:  3 tablets in the morning and 4 tablets at night. Week 8:  4 tablets in the morning and 4 tablets at night. And continue this dose. 04/03/20 05/01/20  [provider]  predniSONE (DELTASONE) 10 MG tablet Take 2 tablets (20 mg total) by mouth daily. 09/15/20   Avegno, Darrelyn Hillock, FNP  propranolol (INDERAL) 10 MG tablet Take 10 mg by mouth 2 (two) times daily. 03/11/20   [provider]  topiramate (TOPAMAX) 100 MG tablet Take 200 mg by mouth at bedtime. 01/23/20   [provider]  venlafaxine (EFFEXOR) 37.5 MG tablet Take 50 mg by mouth 2 (two) times daily with a meal.  Patient not taking: No sig reported 12/20/14   [provider]      Allergies    Amoxicillin    Review of Systems   Review of Systems  Constitutional:  Negative for chills and fever.  HENT:  Negative for ear pain and sore throat.   Eyes:  Negative for pain and visual disturbance.  Respiratory:  Negative for cough and shortness of breath.   Cardiovascular:  Positive for chest pain. Negative for palpitations.  Gastrointestinal:  Negative for abdominal pain and vomiting.  Genitourinary:  Negative for dysuria and hematuria.  Musculoskeletal:  Negative for arthralgias and back pain.  Skin:  Negative for color change and rash.  Neurological:  Negative for seizures and syncope.  All other systems reviewed and are negative.  Physical Exam Updated Vital Signs BP 100/68    Pulse 63    Temp 98.6 F (37 C) (Oral)    Resp 16    Ht 1.651 m (5\' 5" )    Wt 69.3 kg    SpO2 99%     BMI 25.41 kg/m  Physical Exam Vitals and nursing note reviewed.  Constitutional:      General: She is not in acute distress.    Appearance: Normal appearance. She is well-developed.  HENT:     Head: Normocephalic and atraumatic.  Eyes:     Extraocular Movements: Extraocular movements intact.     Conjunctiva/sclera: Conjunctivae normal.     Pupils: Pupils are equal, round, and reactive to light.  Cardiovascular:     Rate and Rhythm: Normal rate and regular rhythm.     Heart sounds: No murmur heard. Pulmonary:     Effort: Pulmonary effort is normal. No respiratory distress.     Breath sounds: Normal breath sounds. No wheezing.  Abdominal:     Palpations: Abdomen is soft.     Tenderness: There is no abdominal tenderness.  Musculoskeletal:        General: No swelling.     Cervical back: Normal range of motion and neck supple.  Skin:    General: Skin is warm and dry.     Capillary Refill: Capillary refill takes less than 2 seconds.  Neurological:     General: No focal deficit present.     Mental Status: She is alert and oriented to person, place, and time.     Cranial Nerves: No cranial nerve deficit.     Sensory: No sensory deficit.     Motor: No weakness.  Psychiatric:        Mood and Affect: Mood normal.    ED Results / Procedures / Treatments   Labs (all labs ordered are listed, but only abnormal results are displayed) Labs Reviewed  CBC WITH DIFFERENTIAL/PLATELET - Abnormal; Notable for the following components:      Result Value   WBC 14.3 (*)    Neutro Abs 11.1 (*)    All other components within normal limits  COMPREHENSIVE METABOLIC PANEL - Abnormal; Notable for the following components:   Potassium 3.2 (*)    CO2 19 (*)    Glucose, Bld 100 (*)    Calcium 8.6 (*)    Albumin 3.4 (*)    All other components within normal limits  D-DIMER, QUANTITATIVE - Abnormal; Notable for the following components:   D-Dimer, Quant 0.64 (*)    All other components within  normal limits  I-STAT BETA HCG BLOOD, ED ([facility], [facility], [facility] ONLY) - Abnormal; Notable for the following components:   I-stat hCG, quantitative 7.6 (*)    All other components within normal limits  I-STAT CHEM 8, ED - Abnormal; Notable for the following components:   TCO2 20 (*)    All other components  within normal limits  HCG, QUANTITATIVE, PREGNANCY  TROPONIN I (HIGH SENSITIVITY)  TROPONIN I (HIGH SENSITIVITY)    EKG EKG Interpretation  Date/Time:  Friday October 09 2021 07:01:41 EST Ventricular Rate:  76 PR Interval:  122 QRS Duration: 79 QT Interval:  384 QTC Calculation: 432 R Axis:   79 Text Interpretation: Sinus rhythm Baseline wander in lead(s) II III aVF Confirmed by Fredia Sorrow [id number]) on 10/09/2021 7:17:22 AM  Radiology CT Angio Chest PE W/Cm &/Or Wo Cm  Result Date: 10/09/2021 CLINICAL DATA:  Shortness of breath and chest pain. Difficulty swallowing EXAM: CT ANGIOGRAPHY CHEST WITH CONTRAST TECHNIQUE: Multidetector CT imaging of the chest was performed using the standard protocol during bolus administration of intravenous contrast. Multiplanar CT image reconstructions and MIPs were obtained to evaluate the vascular anatomy. RADIATION DOSE REDUCTION: This exam was performed according to the departmental dose-optimization program which includes automated exposure control, adjustment of the mA and/or kV according to patient size and/or use of iterative reconstruction technique. CONTRAST:  41mL OMNIPAQUE IOHEXOL 350 MG/ML SOLN COMPARISON:  None. FINDINGS: Cardiovascular: No pulmonary embolism identified. Heart size is normal. No pericardial effusion visualized. Main pulmonary artery is normal caliber. Thoracic aorta is normal in course and caliber. Mediastinum/Nodes: Mildly enlarged right hilar lymph node measuring 11 mm. Wall thickening of the esophagus most prominent distally. Lungs/Pleura: No focal consolidation identified. Small irregular density in the right upper lobe anteriorly  likely represents scarring or subsegmental atelectasis. Note is made of a 3 mm pleural-based nodular density in the right upper lobe image 48. No pleural effusion or pneumothorax. Upper Abdomen: No acute process identified in the upper abdomen. Musculoskeletal: No chest wall abnormality. No acute or significant osseous findings. Review of the MIP images confirms the above findings. IMPRESSION: 1. No pulmonary embolism identified. 2. Esophageal wall thickening most significant distally. Correlate for possible esophagitis/reflux and consider follow-up with GI/endoscopy as indicated. 3. Mildly enlarged right hilar lymph node, nonspecific. 4. Small density in the right upper lobe likely represents scarring or subsegmental atelectasis. Electronically Signed   By: Ofilia Neas M.D.   On: 10/09/2021 09:36   DG Chest Port 1 View  Result Date: 10/09/2021 CLINICAL DATA:  Chest pain EXAM: PORTABLE CHEST 1 VIEW COMPARISON:  Radiograph 09/15/2020 FINDINGS: The heart size and mediastinal contours are within normal limits. Both lungs are clear. The visualized skeletal structures are unremarkable. IMPRESSION: No evidence of acute cardiopulmonary disease. Electronically Signed   By: Maurine Simmering M.D.   On: 10/09/2021 07:59    Procedures Procedures     Medications Ordered in ED Medications  pantoprazole (PROTONIX) injection 40 mg (has no administration in time range)  iohexol (OMNIPAQUE) 350 MG/ML injection 75 mL (75 mLs Intravenous Contrast Given 10/09/21 0919)    ED Course/ Medical Decision Making/ A&P                           Medical Decision Making Amount and/or Complexity of Data Reviewed Labs: ordered. Radiology: ordered.  Risk Prescription drug management.  Patient sent in by urgent care when evaluated yesterday to rule out PE.  Patient's D-dimer was elevated here today at 0.64.  So CT angio was done.  CT angio shows no signs of pulmonary embolus.  No signs of any pneumonia.  But does show a lot  of swelling in the distal esophagus.  Which goes along clinically which I told the patient it sounded more like esophageal spasm.  Will give Protonix.  Also her i-STAT beta-hCG was slightly elevated so we will confirm that with a quantitative hCG.  Patient does not feel that she would be early pregnancy.  In addition patient's regular chest x-ray was negative.  Patient initial troponin was normal making it highly unlikely that this is acute cardiac event.  And and based on the history and physical findings not consistent with that.  Potassium slightly low on complete metabolic panel 3.2.  Liver function tests normal.  Patient will be stable for discharge home.  She can once no better quantitative hCG.  Will probably start her on Prilosec.  Have her follow-up with GI medicine.   Quantitative hCG is negative.  No pregnancy.  We will treat with Prilosec for the esophagitis.  Patient will have follow-up with Dr. Abbey Chatters.  Final Clinical Impression(s) / ED Diagnoses Final diagnoses:  Atypical chest pain  Esophagitis    Rx / DC Orders ED Discharge Orders     None         Fredia Sorrow, MD 10/09/21 1037    Fredia Sorrow, MD 10/09/21 1309

## 2021-10-09 NOTE — Discharge Instructions (Addendum)
Schedule appointment with GI medicine.  For consideration for upper endoscopy.  Take the Prilosec as directed.  Work-up here today showed no evidence of any acute heart problem.  No evidence of any blood clots in the lungs.  But does show distal esophageal swelling.

## 2021-10-09 NOTE — ED Triage Notes (Signed)
Pt arrives with c/o chest pain to center of chest x3 days. Reports pain is constant and makes it difficult to swallow at times. Sitting straight up helps to alleviate pain. Went to urgent care yesterday and told to present to ED to assess for possible PE. Denies n/v, diaphoresis, or radiation.

## 2021-10-09 NOTE — ED Notes (Addendum)
Patient could not be transported to CT due to lab

## 2021-10-15 ENCOUNTER — Encounter: Payer: Self-pay | Admitting: Internal Medicine

## 2021-10-15 ENCOUNTER — Telehealth: Payer: Self-pay | Admitting: Internal Medicine

## 2021-10-15 NOTE — Telephone Encounter (Signed)
Let see if we get this patient in clinic sooner rather than later.  Okay to use one of my urgent slots if needed.  Thank you

## 2021-10-16 ENCOUNTER — Ambulatory Visit (INDEPENDENT_AMBULATORY_CARE_PROVIDER_SITE_OTHER): Payer: BC Managed Care – PPO | Admitting: Obstetrics and Gynecology

## 2021-10-16 ENCOUNTER — Other Ambulatory Visit (HOSPITAL_COMMUNITY)
Admission: RE | Admit: 2021-10-16 | Discharge: 2021-10-16 | Disposition: A | Discharge status: Home / Self Care | Payer: BC Managed Care – PPO | Source: Ambulatory Visit | Attending: Obstetrics and Gynecology | Admitting: Obstetrics and Gynecology

## 2021-10-16 ENCOUNTER — Encounter: Payer: Self-pay | Admitting: Obstetrics and Gynecology

## 2021-10-16 ENCOUNTER — Other Ambulatory Visit: Payer: Self-pay

## 2021-10-16 VITALS — BP 113/73 | HR 71 | Resp 16 | Ht 65.0 in | Wt 144.0 lb

## 2021-10-16 DIAGNOSIS — Z01812 Encounter for preprocedural laboratory examination: Secondary | ICD-10-CM | POA: Diagnosis not present

## 2021-10-16 DIAGNOSIS — Z113 Encounter for screening for infections with a predominantly sexual mode of transmission: Secondary | ICD-10-CM

## 2021-10-16 DIAGNOSIS — Z01419 Encounter for gynecological examination (general) (routine) without abnormal findings: Secondary | ICD-10-CM | POA: Diagnosis not present

## 2021-10-16 DIAGNOSIS — Z30430 Encounter for insertion of intrauterine contraceptive device: Secondary | ICD-10-CM

## 2021-10-16 LAB — POCT URINE PREGNANCY: Preg Test, Ur: NEGATIVE

## 2021-10-16 MED ORDER — LEVONORGESTREL 20 MCG/DAY IU IUD
1.0000 | INTRAUTERINE_SYSTEM | Freq: Once | INTRAUTERINE | Status: AC
  Administered 2021-10-16: 1 via INTRAUTERINE

## 2021-10-16 NOTE — Progress Notes (Signed)
GYNECOLOGY ANNUAL PREVENTATIVE CARE ENCOUNTER NOTE  History:     Deanna Wong is a 31 y.o. G2P2 female here for a routine annual gynecologic exam.  Current complaints: none. Family history of blood clots, was told by PCP to stop estrogen containing BC.   Denies abnormal vaginal bleeding, discharge, pelvic pain, problems with intercourse or other gynecologic concerns.   Going through divorce; interested in a progesterone form of BC. Maternal Aunt with breast cancer, did not pass away.    Gynecologic History Patient's last menstrual period was 10/06/2021. Contraception: none Last Pap: NA   Obstetric History OB History  Gravida Para Term Preterm AB Living  2 2       2   SAB IAB Ectopic Multiple Live Births          2    # Outcome Date GA Lbr Len/2nd Weight Sex Delivery Anes PTL Lv  2 Para           1 Para             Past Medical History:  Diagnosis Date   Anxiety    Depression    Ovarian cyst    Seizures (HCC)     Past Surgical History:  Procedure Laterality Date   CESAREAN SECTION     tubes in ears      Current Outpatient Medications on File Prior to Visit  Medication Sig Dispense Refill   clonazePAM (KLONOPIN) 0.5 MG tablet Take 0.5 mg by mouth daily as needed for anxiety.     FLUoxetine (PROZAC) 40 MG capsule Take 40 mg by mouth daily.     omeprazole (PRILOSEC) 20 MG capsule Take 1 capsule (20 mg total) by mouth daily. 30 capsule 1   topiramate (TOPAMAX) 100 MG tablet Take 100 mg by mouth at bedtime.     ibuprofen (ADVIL,MOTRIN) 200 MG tablet Take 200 mg by mouth every 6 (six) hours as needed for cramping.     Iron-Vitamin C (VITRON-C) 65-125 MG TABS Take 1 tablet by mouth daily. 90 tablet 0   No current facility-administered medications on file prior to visit.    No Known Allergies  Social History:  reports that she has been smoking cigarettes. She has a 5.00 pack-year smoking history. She has never used smokeless tobacco. She reports current alcohol  use. She reports that she does not use drugs.  Family History  Problem Relation Age of Onset   Stroke Mother    Breast cancer Maternal Aunt     The following portions of the patient's history were reviewed and updated as appropriate: allergies, current medications, past family history, past medical history, past social history, past surgical history and problem list.  Review of Systems Pertinent items noted in HPI and remainder of comprehensive ROS otherwise negative.  Physical Exam:  BP 113/73    Pulse 71    Resp 16    Ht 5\' 5"  (1.651 m)    Wt 144 lb (65.3 kg)    LMP 10/06/2021    BMI 23.96 kg/m  CONSTITUTIONAL: Well-developed, well-nourished female in no acute distress.  HENT:  Normocephalic, atraumatic, External right and left ear normal.  EYES: Conjunctivae and EOM are normal. Pupils are equal, round, and reactive to light. No scleral icterus.  NECK: Normal range of motion, supple, no masses.  Normal thyroid.  SKIN: Skin is warm and dry. No rash noted. Not diaphoretic. No erythema. No pallor. MUSCULOSKELETAL: Normal range of motion. No tenderness.  No cyanosis, clubbing,  or edema. NEUROLOGIC: Alert and oriented to person, place, and time. Normal reflexes, muscle tone coordination.  PSYCHIATRIC: Normal mood and affect. Normal behavior. Normal judgment and thought content. CARDIOVASCULAR: Normal heart rate noted, regular rhythm RESPIRATORY: Clear to auscultation bilaterally. Effort and breath sounds normal, no problems with respiration noted. BREASTS: Symmetric in size. No masses, tenderness, skin changes, nipple drainage, or lymphadenopathy bilaterally. Performed in the presence of a chaperone. ABDOMEN: Soft, no distention noted.  No tenderness, rebound or guarding.  PELVIC: Normal appearing external genitalia and urethral meatus; normal appearing vaginal mucosa and cervix.  No abnormal vaginal discharge noted.  Pap smear obtained.  Normal uterine size, no other palpable masses, no  uterine or adnexal tenderness.  Performed in the presence of a chaperone.   Assessment and Plan:   1. Screening examination for STD (sexually transmitted disease)  - HIV antibody (with reflex) - RPR - Hepatitis B Surface AntiGEN - Hepatitis C Antibody - Cervicovaginal ancillary only( [place])  2. Well woman exam with routine gynecological exam  - Cytology - PAP( [place])  3. Encounter for IUD insertion  - POCT urine pregnancy - Negative    Will follow up results of pap smear and manage accordingly. Routine preventative health maintenance measures emphasized. Please refer to After Visit Summary for other counseling recommendations.       GYNECOLOGY CLINIC PROCEDURE NOTE  [name] is a 31 y.o. G2P2 here for Mirena  IUD insertion. No GYN concerns.   IUD Insertion Procedure Note Patient identified, informed consent performed, consent signed.   Discussed risks of irregular bleeding, cramping, infection, malpositioning or misplacement of the IUD outside the uterus which may require further procedure such as laparoscopy. Time out was performed.  Urine pregnancy test negative.  Speculum placed in the vagina.  Cervix visualized.  Cleaned with Betadine x 2.  Grasped anteriorly with a single tooth tenaculum.  Uterus sounded to 7 cm.  Mirena IUD placed per manufacturer's recommendations.  Strings trimmed to 3 cm. Tenaculum was removed, good hemostasis noted.  Patient tolerated procedure well.   Patient was given post-procedure instructions.  She was advised to have backup contraception for one week.  Patient was also asked to check IUD strings periodically and follow up in 4 weeks for IUD check.    Venia Carbon I, NP 10/16/2021 11:08 AM

## 2021-10-19 LAB — RPR: RPR Ser Ql: NONREACTIVE

## 2021-10-19 LAB — HEPATITIS C ANTIBODY
Hepatitis C Ab: NONREACTIVE
SIGNAL TO CUT-OFF: <0.02 (ref <1.00)

## 2021-10-19 LAB — CERVICOVAGINAL ANCILLARY ONLY
Comment: NEGATIVE
Trichomonas: NEGATIVE

## 2021-10-19 LAB — HEPATITIS B SURFACE ANTIGEN: Hepatitis B Surface Ag: NONREACTIVE

## 2021-10-19 LAB — HIV ANTIBODY (ROUTINE TESTING W REFLEX): HIV 1&2 Ab, 4th Generation: NONREACTIVE

## 2021-10-20 LAB — CYTOLOGY - PAP
Chlamydia: NEGATIVE
Comment: NEGATIVE
Comment: NEGATIVE
Comment: NEGATIVE
Comment: NORMAL
Diagnosis: UNDETERMINED — AB
High risk HPV: NEGATIVE
Neisseria Gonorrhea: NEGATIVE
Trichomonas: NEGATIVE

## 2021-11-09 DIAGNOSIS — F410 Panic disorder [episodic paroxysmal anxiety] without agoraphobia: Secondary | ICD-10-CM | POA: Diagnosis not present

## 2021-11-09 DIAGNOSIS — F4312 Post-traumatic stress disorder, chronic: Secondary | ICD-10-CM | POA: Diagnosis not present

## 2021-11-09 DIAGNOSIS — F331 Major depressive disorder, recurrent, moderate: Secondary | ICD-10-CM | POA: Diagnosis not present

## 2021-11-10 ENCOUNTER — Ambulatory Visit (INDEPENDENT_AMBULATORY_CARE_PROVIDER_SITE_OTHER): Payer: BC Managed Care – PPO | Admitting: Advanced Practice Midwife

## 2021-11-10 ENCOUNTER — Other Ambulatory Visit: Payer: Self-pay

## 2021-11-10 ENCOUNTER — Encounter: Payer: Self-pay | Admitting: Advanced Practice Midwife

## 2021-11-10 VITALS — BP 109/77 | HR 69 | Ht 65.0 in | Wt 145.0 lb

## 2021-11-10 DIAGNOSIS — T8332XA Displacement of intrauterine contraceptive device, initial encounter: Secondary | ICD-10-CM

## 2021-11-10 DIAGNOSIS — Z975 Presence of (intrauterine) contraceptive device: Secondary | ICD-10-CM | POA: Diagnosis not present

## 2021-11-10 DIAGNOSIS — N921 Excessive and frequent menstruation with irregular cycle: Secondary | ICD-10-CM | POA: Diagnosis not present

## 2021-11-10 MED ORDER — SLYND 4 MG PO TABS: 1.0000 | ORAL_TABLET | Freq: Every day | ORAL | 2 refills | Status: DC

## 2021-11-10 NOTE — Progress Notes (Signed)
? ?  GYNECOLOGY CLINIC PROGRESS NOTE ? ?History:  ?31 y.o. G2P2 here at [facility] today for today for IUD string check; Mirena IUD was placed  10/16/21. Pt reports daily light bleeding since IUD insertion, denies any pain or other complications. ? ?The following portions of the patient's history were reviewed and updated as appropriate: allergies, current medications, past family history, past medical history, past social history, past surgical history and problem list. Last pap smear on 10/16/21 with ASCUS and negative HPV, repeat Pap in 3 years.   ? ?Review of Systems:  ?Pertinent items are noted in HPI. ?  ?Objective:  ?Physical Exam ?Blood pressure 109/77, pulse 69, height 5\' 5"  (1.651 m), weight 145 lb (65.8 kg). ?Gen: NAD ?Abd: Soft, nontender and nondistended ?Pelvic: Normal appearing external genitalia; normal appearing vaginal mucosa and cervix.  Light red bleeding requiring 1 fox swab to visualize cervix, IUD strings not visualized, not able to locate using Pap brush during exam.  ? ?Assessment & Plan:  ?1. Breakthrough bleeding associated with intrauterine device (IUD) ?--Bleeding daily since IUD insertion, light but continuous bleeding ?--Discussed options, pt PCP recommended she stop estrogen containing contraceptives due to family history of DVT/PE.  Offered Slynd to stop bleeding, initiate a withdrawal bleed.   ?--Rx for Slynd and sample x 28 days given in the office ?- PELVIC COMPLETE WITH TRANSVAGINAL; Future ? ?2. Intrauterine contraceptive device threads lost, initial encounter ?--Strings not visible on today's exam, unable to retrieve with Pap cervical brush.   ?--Pt denies any pain ?--F/U US for IUD placement ? ?- US PELVIC COMPLETE WITH TRANSVAGINAL; Future  ? ?[place], CNM ?10:34 AM  ?

## 2021-11-16 ENCOUNTER — Other Ambulatory Visit: Payer: Self-pay

## 2021-11-16 ENCOUNTER — Ambulatory Visit (INDEPENDENT_AMBULATORY_CARE_PROVIDER_SITE_OTHER): Payer: BC Managed Care – PPO

## 2021-11-16 DIAGNOSIS — N921 Excessive and frequent menstruation with irregular cycle: Secondary | ICD-10-CM | POA: Diagnosis not present

## 2021-11-16 DIAGNOSIS — N939 Abnormal uterine and vaginal bleeding, unspecified: Secondary | ICD-10-CM | POA: Diagnosis not present

## 2021-11-16 DIAGNOSIS — T8332XA Displacement of intrauterine contraceptive device, initial encounter: Secondary | ICD-10-CM

## 2021-11-16 DIAGNOSIS — Z975 Presence of (intrauterine) contraceptive device: Secondary | ICD-10-CM | POA: Diagnosis not present

## 2021-11-25 ENCOUNTER — Encounter: Payer: Self-pay | Admitting: Internal Medicine

## 2021-11-25 ENCOUNTER — Ambulatory Visit: Payer: BC Managed Care – PPO | Admitting: Internal Medicine

## 2022-01-26 ENCOUNTER — Ambulatory Visit: Payer: Self-pay

## 2022-02-10 ENCOUNTER — Ambulatory Visit: Payer: Self-pay

## 2022-02-10 ENCOUNTER — Telehealth: Payer: Self-pay | Admitting: *Deleted

## 2022-02-10 NOTE — Telephone Encounter (Signed)
Left patient a message to call the office about scheduling appointment.

## 2022-02-11 ENCOUNTER — Ambulatory Visit: Payer: Self-pay

## 2022-02-23 ENCOUNTER — Ambulatory Visit: Payer: 59

## 2022-02-23 VITALS — BP 111/75 | HR 66 | Ht 65.0 in | Wt 146.0 lb

## 2022-02-23 DIAGNOSIS — Z30432 Encounter for removal of intrauterine contraceptive device: Secondary | ICD-10-CM

## 2022-02-23 NOTE — Progress Notes (Signed)
    GYNECOLOGY CLINIC PROCEDURE NOTE  [name] is a 31 y.o. G2P2 here for Mirena IUD removal. No GYN concerns.  Last pap smear was on 10/16/2021 and was ASCUS negative HRHPV.  Health Maintenance Due  Topic Date Due   COVID-19 Vaccine (1) Never done   TETANUS/TDAP  Never done     Review of Systems:  Pertinent items are noted in HPI.   Objective:  Physical Exam Blood pressure 111/75, pulse 66, height 5\' 5"  (1.651 m), weight 146 lb (66.2 kg). VS reviewed, nursing note reviewed,  Constitutional: well developed, well nourished, no distress HEENT: normocephalic CV: normal rate Pulm/chest wall: normal effort Breast Exam: deferred Abdomen: soft Neuro: alert and oriented x 3 Skin: warm, dry Psych: affect normal Pelvic exam: Cervix pink, visually closed, without lesion, scant white creamy discharge, vaginal walls and external genitalia normal  IUD Removal  Patient was in the dorsal lithotomy position, normal external genitalia was noted.  A speculum was placed in the patient's vagina, normal discharge was noted, no lesions. The multiparous cervix was visualized, no lesions, no abnormal discharge. The strings of the IUD were not visualized, so cyto brush was introduced into the endometrial cavity. The IUD strings were visualized and the IUD was grasped and removed in its entirety).  Patient tolerated the procedure well.    Patient will use POP's for contraception. She has prescription at pharmacy. Routine preventative health maintenance measures emphasized.   , CNM 02/23/2022 3:19 PM

## 2022-03-02 ENCOUNTER — Ambulatory Visit: Payer: BC Managed Care – PPO | Admitting: Obstetrics and Gynecology

## 2022-09-01 ENCOUNTER — Ambulatory Visit: Payer: 59 | Admitting: Obstetrics and Gynecology

## 2022-09-02 ENCOUNTER — Encounter: Payer: Self-pay | Admitting: Obstetrics and Gynecology

## 2022-09-02 ENCOUNTER — Other Ambulatory Visit (HOSPITAL_COMMUNITY)
Admission: RE | Admit: 2022-09-02 | Discharge: 2022-09-02 | Disposition: A | Discharge status: Home / Self Care | Payer: BC Managed Care – PPO | Source: Ambulatory Visit | Attending: Obstetrics and Gynecology | Admitting: Obstetrics and Gynecology

## 2022-09-02 ENCOUNTER — Ambulatory Visit: Payer: BC Managed Care – PPO | Admitting: Obstetrics and Gynecology

## 2022-09-02 VITALS — BP 121/89 | HR 65 | Resp 16 | Ht 65.0 in | Wt 142.0 lb

## 2022-09-02 DIAGNOSIS — N939 Abnormal uterine and vaginal bleeding, unspecified: Secondary | ICD-10-CM

## 2022-09-02 DIAGNOSIS — Z113 Encounter for screening for infections with a predominantly sexual mode of transmission: Secondary | ICD-10-CM | POA: Diagnosis not present

## 2022-09-02 DIAGNOSIS — R102 Pelvic and perineal pain: Secondary | ICD-10-CM

## 2022-09-02 DIAGNOSIS — N898 Other specified noninflammatory disorders of vagina: Secondary | ICD-10-CM | POA: Insufficient documentation

## 2022-09-02 DIAGNOSIS — N739 Female pelvic inflammatory disease, unspecified: Secondary | ICD-10-CM

## 2022-09-02 MED ORDER — CEFTRIAXONE SODIUM 500 MG IJ SOLR
500.0000 mg | Freq: Once | INTRAMUSCULAR | Status: AC
  Administered 2022-09-02: 500 mg via INTRAMUSCULAR

## 2022-09-02 MED ORDER — DOXYCYCLINE HYCLATE 100 MG PO CAPS: 100.0000 mg | ORAL_CAPSULE | Freq: Two times a day (BID) | ORAL | 0 refills | Status: AC

## 2022-09-02 MED ORDER — METRONIDAZOLE 500 MG PO TABS
500.0000 mg | ORAL_TABLET | Freq: Two times a day (BID) | ORAL | 0 refills | Status: AC
Start: 2022-09-02 — End: 2022-09-16

## 2022-09-02 NOTE — Patient Instructions (Addendum)
We talked about A LOT during our visit today -   The big thing we can't miss is an infection of the uterus & fallopian tubes called pelvic inflammatory disease (PID).  This can be caused by a sexually transmitted infection OR normal bacteria from the vagina.  Symptoms include persistent pelvic pain, vaginal discharge, fevers, not feeling well, or nausea/vomiting. While your story isn't 100% consistent with PID, you can end up with a more serious infection or scars on the fallopian tubes if you have it and we don't treat it. That is why we started antibiotics today.  Keep taking the antibiotics until they run out. If this is PID, your pain should get better with antibiotics.  The heavy bleeding earlier this month is probably from the Depo Provera and a condition called adenomyosis. Since you've been so fatigued it could also be a thyroid issue. Adenomyosis is not dangerous for pregnancy. It causes heavy and painful periods and is often treated with birth control. There are also surgical options, but we don't do these until you're done having children. We will do a blood test for your thyroid. That can be done when it's convenient for you.  You can take ibuprofen 800mg  every 8 hours as needed for pain.  Switch to your progesterone-only birth control pills. They could also help your pain and bleeding. They also don't affect your fertility like Depo Provera.  Talk to your neurologist about your seizure medication and pregnancy.   Start taking a prenatal vitamin   I will check in with you in 2 weeks. If you're still in pain, we will need to see you at an in person appointment.  Please send me a MyChart message if you have questions in the meantime.

## 2022-09-02 NOTE — Progress Notes (Signed)
Last Depo Provera injection 9/18

## 2022-09-02 NOTE — Progress Notes (Signed)
RETURN GYNECOLOGY VISIT  Subjective:  Deanna Wong is a 31 y.o. G2P2 with LMP 08/22/22 presenting for ED follow up of AUB and pelvic pain.  Seen in ED 12/19 for 3 days of HVB and severe lower abdominal pain. She got a period when she expected, then a few days later the heavy bleeding started. She had gotten her first depo shot on 05/24/22. Her ED workup was largely unrevealing (see below), pelvic US c/f adenomyosis - she was discharged home in stable condition.   Since her ED visit, her bleeding has stopped, but she has persistent lower abdominal/pelvic pain x 2 weeks, worst in LLQ. She also notes malaise, fatigue, and intermittent vomiting. Tolerating PO but vomited suddenly in her car. Today she started to have copious yellow/clear discharge. No urinary/bowel complaints. No fevers/chills.  Menstrual Hx: Menarche at 31yo, started COCs at 31yo due to heavy & painful periods. Has been on hormonal birth control for most of her life for control of her periods. After her children, her periods normalized for a while. Recently had IUD removed and started Depo as above.  Sexually active w/ female partner. Would like to try to conceive in the spring.  I personally reviewed the following: ED note by Dr. Tressie Ellis on 08/24/22 CBC 08/24/22 8.5>14.5<264 Chlamydia/Gonorrhea 08/24/22 negative BMP 08/24/22 WNL INR/PTT 08/24/22 0.98/33.3 hCG 08/24/22 <2.6 Endovaginal [place] 08/24/22 -Heterogeneous uterus. Tiny echogenic foci with posterior linear shadowing in the myometrium, which can be seen with adenomyosis in the correct clinical setting or fibroid disease.  -Appropriate vascular flow documented in bilateral ovaries. No evidence of ovarian torsion at the time of examination.  -Right ovarian corpus luteal cyst versus dominant follicle.   I reviewed her medical, surgical, medication, obstetric, and social histories as documented.  Objective:   Vitals:   09/02/22 1405  BP: 121/89  Pulse: 65   Resp: 16  Weight: 142 lb (64.4 kg)  Height: 5\' 5"  (1.651 m)   General:  Alert, oriented and cooperative. Patient is in no acute distress.  Skin: Skin is warm and dry. No rash noted.   Cardiovascular: Normal heart rate noted  Respiratory: Normal respiratory effort, no problems with respiration noted  Abdomen: Soft, mild LLQ/suprapubic tenderness. No rebound or guarding. No masses  Pelvic: Performed in presence of chaperone. Thin yellow cervical mucus, normal appearing cervix, no CMT. +uterine, L adnexal tenderness   Assessment and Plan:  Deanna Wong is a 31 y.o. with pelvic pain & AUB. Possible PID given vaginal discharge, malaise, vomiting and tenderness on exam. AUB is otherwise likely iatrogenic in s/o Depo provera  Pelvic inflammatory disease Discussed with patient and her partner that this is our "can't miss" diagnosis since it can lead to issues with fertility or more severe/systemic infection. Will treat for presumptive PID -     cefTRIAXone (ROCEPHIN) injection 500 mg -     doxycycline (VIBRAMYCIN) 100 MG capsule; Take 1 capsule (100 mg total) by mouth 2 (two) times daily for 14 days. -     metroNIDAZOLE (FLAGYL) 500 MG tablet; Take 1 tablet (500 mg total) by mouth 2 (two) times daily for 14 days. -     Cervicovaginal ancillary only( [place])  Abnormal uterine bleeding (AUB)  We reviewed a focused differential diagnosis for AUB that includes, but is not limited to anovulatory cycles (menopausal transition/POI, PCOS, hypothalamic hypogonadism), endometrial/endocervical polyps, adenomyosis, fibroids, infection (e.g., endometritis), medication side effect (blood thinners, contraceptives, some antipsychotics), and thyroid disease. Given her history, exam &  work up thus far, the most likely etiology is adenomyosis vs iatrogenic bleeding from Depo Provera. -     TSH  Pelvic pain Discussed ddx - adenomyosis, dysmenorrhea w/ can't miss diagnosis of ectopic, torsion, and PID. Exam  not c/w torsion, pregnancy test previously negative.  Recommended ibuprofen 800mg  every 8 hours prn for pain control  Vaginal discharge -     Cervicovaginal ancillary only( [place])  Preconception counseling History of seizures, following with neuro & on Topamax - discussed meeting with neuro before TTC to discuss AED plan. Reviewed that while Topamax is not preferred, there are times that benefits outweigh risks (I.e., seizures unable to be controlled with alternatives) Start folate supplementation POPs until ready to conceive  Return in about 2 weeks (around 09/16/2022) for follow up symptoms.  11/15/2022, MD

## 2022-09-03 LAB — CERVICOVAGINAL ANCILLARY ONLY
Bacterial Vaginitis (gardnerella): POSITIVE — AB
Candida Glabrata: NEGATIVE
Candida Vaginitis: NEGATIVE
Chlamydia: NEGATIVE
Comment: NEGATIVE
Comment: NEGATIVE
Comment: NEGATIVE
Comment: NEGATIVE
Comment: NEGATIVE
Comment: NORMAL
Neisseria Gonorrhea: NEGATIVE
Trichomonas: NEGATIVE

## 2022-09-03 MED ORDER — ONDANSETRON 4 MG PO TBDP: 4.0000 mg | ORAL_TABLET | Freq: Four times a day (QID) | ORAL | 0 refills | Status: DC | PRN

## 2022-09-20 ENCOUNTER — Telehealth: Payer: Self-pay | Admitting: *Deleted

## 2022-09-20 MED ORDER — ONDANSETRON 4 MG PO TBDP
4.0000 mg | ORAL_TABLET | Freq: Four times a day (QID) | ORAL | 0 refills | Status: DC | PRN
Start: 2022-09-20 — End: 2022-12-14

## 2022-09-20 NOTE — Telephone Encounter (Signed)
Pt called requesting RF on Zofran.  RF sent to her pharmacy

## 2022-12-13 ENCOUNTER — Encounter: Payer: Self-pay | Admitting: Obstetrics and Gynecology

## 2022-12-14 ENCOUNTER — Ambulatory Visit: Payer: 59

## 2022-12-14 VITALS — BP 117/77 | HR 75 | Ht 65.0 in | Wt 154.0 lb

## 2022-12-14 DIAGNOSIS — N925 Other specified irregular menstruation: Secondary | ICD-10-CM | POA: Diagnosis not present

## 2022-12-14 DIAGNOSIS — Z3200 Encounter for pregnancy test, result unknown: Secondary | ICD-10-CM

## 2022-12-14 DIAGNOSIS — Z3202 Encounter for pregnancy test, result negative: Secondary | ICD-10-CM

## 2022-12-14 LAB — POCT URINE PREGNANCY: Preg Test, Ur: NEGATIVE

## 2022-12-14 NOTE — Progress Notes (Signed)
   GYNECOLOGY PROGRESS NOTE  History:  32 y.o. G2P2 presents to [facility] office today for positive pregnancy test. She reports she had a positive 2 days prior to period starting on 3/31. Reports another positive pregnancy test during her period. She reports she and her significant other are trying to conceive. Bleeding started on 3/31 and ended on 4/4. Bleeding was consistent with her typical periods. Changed her vaginal cup 5-6 times per day. Had some mild cramping during cycle.   The following portions of the patient's history were reviewed and updated as appropriate: allergies, current medications, past family history, past medical history, past social history, past surgical history and problem list. Last pap smear on 10/16/2021 was ASCUS, negative HRHPV.  Health Maintenance Due  Topic Date Due   COVID-19 Vaccine (1) Never done   DTaP/Tdap/Td (1 - Tdap) Never done     Review of Systems:  Pertinent items are noted in HPI.   Objective:  Physical Exam Blood pressure 117/77, pulse 75, height 5\' 5"  (1.651 m), weight 154 lb (69.9 kg), last menstrual period 12/05/2022. VS reviewed, nursing note reviewed,  Constitutional: well developed, well nourished, no distress HEENT: normocephalic CV: normal rate Pulm/chest wall: normal effort Breast Exam: deferred Abdomen: soft Neuro: alert and oriented x 3 Skin: warm, dry Psych: affect normal Pelvic exam: Cervix pink, visually closed, without lesion, scant white creamy discharge, vaginal walls and external genitalia normal Bimanual exam: Cervix 0/long/high, firm, anterior, neg CMT, uterus nontender, nonenlarged, adnexa without tenderness, enlargement, or mass  Assessment & Plan:  1. Possible pregnancy, not confirmed - UPT negative today - Will order bHCG and follow up as indicated   - POCT urine pregnancy   No follow-ups on file.   Brand Males, CNM 2:21 PM

## 2022-12-15 LAB — BETA HCG QUANT (REF LAB): hCG Quant: <1 m[IU]/mL

## 2023-02-02 ENCOUNTER — Ambulatory Visit: Payer: 59

## 2023-02-02 DIAGNOSIS — Z3200 Encounter for pregnancy test, result unknown: Secondary | ICD-10-CM

## 2023-02-02 NOTE — Progress Notes (Signed)
Pt here for BHCG for positive UPT.

## 2023-02-03 LAB — BETA HCG QUANT (REF LAB): hCG Quant: <1 m[IU]/mL

## 2023-02-04 ENCOUNTER — Ambulatory Visit: Payer: 59

## 2023-02-10 ENCOUNTER — Telehealth: Payer: Self-pay | Admitting: *Deleted

## 2023-02-10 NOTE — Telephone Encounter (Signed)
No answer no voice mail  

## 2023-02-14 ENCOUNTER — Ambulatory Visit: Payer: 59 | Admitting: Obstetrics & Gynecology

## 2023-03-16 ENCOUNTER — Ambulatory Visit (INDEPENDENT_AMBULATORY_CARE_PROVIDER_SITE_OTHER): Payer: 59 | Admitting: Obstetrics and Gynecology

## 2023-03-16 ENCOUNTER — Encounter: Payer: Self-pay | Admitting: Obstetrics and Gynecology

## 2023-03-16 VITALS — BP 129/89 | HR 81 | Ht 65.0 in | Wt 153.0 lb

## 2023-03-16 DIAGNOSIS — N912 Amenorrhea, unspecified: Secondary | ICD-10-CM

## 2023-03-16 MED ORDER — MEDROXYPROGESTERONE ACETATE 10 MG PO TABS
10.0000 mg | ORAL_TABLET | Freq: Every day | ORAL | 0 refills | Status: DC
Start: 2023-03-16 — End: 2023-11-21

## 2023-03-16 NOTE — Progress Notes (Signed)
   GYNECOLOGY OFFICE VISIT NOTE  History:   Deanna Wong is a 32 y.o. G2P2 here today for amenorrhea. She has not had a period since April. She had a possible chemical pregnancy around that time. She typically has cycles that are very regular Q28 days.   She notes bloating and spontaneous nipple discharge.   She had been on POPs but not recently.    Mom at 56, MGM 71, Great MGM at 66 went through menopause.      Past Medical History:  Diagnosis Date   Anxiety    Depression    Ovarian cyst    Seizures (HCC)     Past Surgical History:  Procedure Laterality Date   CESAREAN SECTION     tubes in ears      The following portions of the patient's history were reviewed and updated as appropriate: allergies, current medications, past family history, past medical history, past social history, past surgical history and problem list.   Health Maintenance:   ASCUS pap and negative HRHPV: Diagnosis  Date Value Ref Range Status  10/16/2021 (A)  Final   - Atypical squamous cells of undetermined significance (ASC-US)     Review of Systems:  Pertinent items noted in HPI and remainder of comprehensive ROS otherwise negative.  Physical Exam:  BP 129/89   Pulse 81   Ht 5\' 5"  (1.651 m)   Wt 153 lb (69.4 kg)   LMP 01/04/2023   BMI 25.46 kg/m  CONSTITUTIONAL: Well-developed, well-nourished female in no acute distress.  HEENT:  Normocephalic, atraumatic. External right and left ear normal. No scleral icterus.  NECK: Normal range of motion, supple, no masses noted on observation SKIN: No rash noted. Not diaphoretic. No erythema. No pallor. MUSCULOSKELETAL: Normal range of motion. No edema noted. NEUROLOGIC: Alert and oriented to person, place, and time. Normal muscle tone coordination. No cranial nerve deficit noted. PSYCHIATRIC: Normal mood and affect. Normal behavior. Normal judgment and thought content.  Labs and Imaging No results found for this or any previous visit (from  the past 168 hour(s)). No results found.  Assessment and Plan:   1. Amenorrhea - Discussed differential for oligomenorrhea - Will check routine labs for this - HCG (urine), LH/FSH, PRL, E2, total and free testosterone, TSH, 17OHP. Check HCG quant once more.  -     17-Hydroxyprogesterone -     Estradiol -     Follicle stimulating hormone -     Luteinizing hormone -     Progesterone -     Prolactin -     Cancel: Testosterone , Free and Total -     TSH -     POCT urine pregnancy -     medroxyPROGESTERone (PROVERA) 10 MG tablet; Take 1 tablet (10 mg total) by mouth daily. Use for ten days -     Testosterone, Free, Total, SHBG -     Beta hCG quant (ref lab)    Routine preventative health maintenance measures emphasized. Please refer to After Visit Summary for other counseling recommendations.   Return if symptoms worsen or fail to improve.  Milas Hock, MD, FACOG Obstetrician & Gynecologist, [facility], [facility]

## 2023-03-17 LAB — TESTOSTERONE, FREE, TOTAL, SHBG
Sex Hormone Binding: 99.8 nmol/L (ref 24.6–122.0)
Testosterone, Free: 0.7 pg/mL (ref 0.0–4.2)
Testosterone: 16 ng/dL (ref 8–60)

## 2023-03-17 LAB — BETA HCG QUANT (REF LAB): hCG Quant: <1 m[IU]/mL

## 2023-03-18 LAB — ESTRADIOL: Estradiol: 1344 pg/mL

## 2023-03-18 LAB — FOLLICLE STIMULATING HORMONE: FSH: 6.1 m[IU]/mL

## 2023-03-18 LAB — PROLACTIN: Prolactin: 33.9 ng/mL — ABNORMAL HIGH (ref 4.8–33.4)

## 2023-03-18 LAB — 17-HYDROXYPROGESTERONE: 17-OH Progesterone LCMS: 486 ng/dL

## 2023-03-18 LAB — PROGESTERONE: Progesterone: 1.8 ng/mL

## 2023-03-18 LAB — LUTEINIZING HORMONE: LH: 12.7 m[IU]/mL

## 2023-03-18 LAB — TSH: TSH: 2.84 u[IU]/mL (ref 0.450–4.500)

## 2023-03-21 ENCOUNTER — Ambulatory Visit (INDEPENDENT_AMBULATORY_CARE_PROVIDER_SITE_OTHER): Payer: 59

## 2023-03-21 ENCOUNTER — Other Ambulatory Visit: Payer: Self-pay | Admitting: *Deleted

## 2023-03-21 DIAGNOSIS — R102 Pelvic and perineal pain: Secondary | ICD-10-CM

## 2023-03-21 DIAGNOSIS — G8929 Other chronic pain: Secondary | ICD-10-CM

## 2023-03-21 NOTE — Progress Notes (Signed)
Orders placed for STAT pelvic U/S to r/o ovarian cyst /unconfirmed pregnancy per Dr Para March

## 2023-03-22 ENCOUNTER — Encounter: Payer: Self-pay | Admitting: Obstetrics and Gynecology

## 2023-03-22 DIAGNOSIS — E250 Congenital adrenogenital disorders associated with enzyme deficiency: Secondary | ICD-10-CM

## 2023-03-29 ENCOUNTER — Other Ambulatory Visit: Payer: Self-pay

## 2023-03-29 DIAGNOSIS — E250 Congenital adrenogenital disorders associated with enzyme deficiency: Secondary | ICD-10-CM

## 2023-05-20 ENCOUNTER — Other Ambulatory Visit: Payer: Self-pay

## 2023-05-20 ENCOUNTER — Encounter: Payer: Self-pay | Admitting: Emergency Medicine

## 2023-05-20 ENCOUNTER — Ambulatory Visit (INDEPENDENT_AMBULATORY_CARE_PROVIDER_SITE_OTHER): Payer: 59

## 2023-05-20 ENCOUNTER — Ambulatory Visit
Admission: EM | Admit: 2023-05-20 | Discharge: 2023-05-20 | Disposition: A | Discharge status: Home / Self Care | Payer: 59 | Attending: Family Medicine | Admitting: Family Medicine

## 2023-05-20 DIAGNOSIS — J209 Acute bronchitis, unspecified: Secondary | ICD-10-CM

## 2023-05-20 DIAGNOSIS — R0789 Other chest pain: Secondary | ICD-10-CM | POA: Diagnosis not present

## 2023-05-20 HISTORY — DX: Post-traumatic stress disorder, unspecified: F43.10

## 2023-05-20 LAB — POCT URINE PREGNANCY: Preg Test, Ur: NEGATIVE

## 2023-05-20 MED ORDER — ALBUTEROL SULFATE HFA 108 (90 BASE) MCG/ACT IN AERS: 2.0000 | INHALATION_SPRAY | Freq: Four times a day (QID) | RESPIRATORY_TRACT | Status: DC | PRN

## 2023-05-20 MED ORDER — IPRATROPIUM-ALBUTEROL 0.5-2.5 (3) MG/3ML IN SOLN: 3.0000 mL | Freq: Four times a day (QID) | RESPIRATORY_TRACT | 0 refills | Status: DC | PRN

## 2023-05-20 MED ORDER — PROMETHAZINE-DM 6.25-15 MG/5ML PO SYRP
5.0000 mL | ORAL_SOLUTION | Freq: Three times a day (TID) | ORAL | 0 refills | Status: DC | PRN
Start: 2023-05-20 — End: 2023-11-21

## 2023-05-20 MED ORDER — ALBUTEROL SULFATE HFA 108 (90 BASE) MCG/ACT IN AERS
2.0000 | INHALATION_SPRAY | Freq: Once | RESPIRATORY_TRACT | Status: AC
  Administered 2023-05-20: 2 via RESPIRATORY_TRACT

## 2023-05-20 NOTE — ED Triage Notes (Signed)
Patient presents to [facility] for evaluation of persistent cough and shortness of breath x 2 weeks after having a URI. Patient was see online and started Amoxicillin, prednisone and benzonatate.  Been taking for 10 days, worsening.

## 2023-05-20 NOTE — Discharge Instructions (Addendum)
We will notify you of your chest x-ray results. You can perform DuoNebs every 6 hours as needed for chest tightness and shortness of breath.  Albuterol 2 puffs every 4- 6 hours as needed for chest tightness or wheezing and unable to perform DuoNebs.  Reviewed your EKG showed no can concerning findings suspect all symptoms are related to bronchitis.  Promethazine DM prescribed for cough.  If your chest x-ray shows any infection we will prescribe a different antibiotic.  Continue with treatment plan as prescribed.

## 2023-05-21 NOTE — ED Provider Notes (Signed)
Deanna Wong    CSN: [id number] Arrival date & time: 05/20/23  0920      History   Chief Complaint Chief Complaint  Patient presents with   Chest Pain    HPI Deanna Wong is a 32 y.o. female  who is daily smoker, migraine headaches, who complains of chest congestion, productive cough, headache, chest tightness, shortness of breath,2 weeks. Patient was treated by an online medical provider with two rounds of antibiotics and steroids without improvement. She denies a history of asthma.  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 Authorizing Provider  albuterol (VENTOLIN HFA) 108 (90 Base) MCG/ACT inhaler Inhale 2 puffs into the lungs every 6 (six) hours as needed for wheezing or shortness of breath. 05/20/23  Yes Bing Neighbors, NP  ipratropium-albuterol (DUONEB) 0.5-2.5 (3) MG/3ML SOLN Take 3 mLs by nebulization every 6 (six) hours as needed. 05/20/23  Yes Bing Neighbors, NP  promethazine-dextromethorphan (PROMETHAZINE-DM) 6.25-15 MG/5ML syrup Take 5 mLs by mouth 3 (three) times daily as needed (cough and upper respiratory symptoms). 05/20/23  Yes Bing Neighbors, NP  clonazePAM (KLONOPIN) 0.5 MG tablet Take 0.5 mg by mouth daily as needed for anxiety.    [provider]  FLUoxetine (PROZAC) 40 MG capsule Take 40 mg  by mouth daily.    [provider]  ibuprofen (ADVIL,MOTRIN) 200 MG tablet Take 200 mg by mouth every 6 (six) hours as needed for cramping.    [provider]  Iron-Vitamin C (VITRON-C) 65-125 MG TABS Take 1 tablet by mouth daily. Patient not taking: Reported on 03/16/2023 03/14/20   Rickard Patience, MD  medroxyPROGESTERone (PROVERA) 10 MG tablet Take 1 tablet (10 mg total) by mouth daily. Use for ten days 03/16/23   Milas Hock, MD  topiramate (TOPAMAX) 100 MG tablet Take 100 mg by mouth at bedtime. 01/23/20   [provider]    Family History Family History  Problem Relation Age of Onset   Hypertension Mother    Stroke Mother    Kidney failure Mother    Breast cancer Maternal Aunt     Social History Social History   Tobacco Use   Smoking status: Every Day    Current packs/day: 0.50    Average packs/day: 0.5 packs/day for 10.0 years (5.0 ttl pk-yrs)    Types: Cigarettes   Smokeless tobacco: Never  Vaping Use   Vaping status: Never Used  Substance Use Topics   Alcohol use:  Yes    Comment: occ   Drug use: No     Allergies   Patient has no known allergies.   Review of Systems Review of Systems  Constitutional:  Positive for fatigue.  Respiratory:  Positive for cough, chest tightness, shortness of breath and wheezing.   Neurological:  Positive for headaches.     Physical Exam Triage Vital Signs ED Triage Vitals  Encounter Vitals Group     BP 05/20/23 1007 133/86     Systolic BP Percentile --      Diastolic BP Percentile --      Pulse Rate 05/20/23 1007 82     Resp 05/20/23 1007 16     Temp 05/20/23 1007 98.8 F (37.1 C)     Temp Source 05/20/23 1007 Oral     SpO2 05/20/23 1007 98 %     Weight --      Height --      Head Circumference --      Peak Flow --      Pain Score 05/20/23 1034 8     Pain Loc --      Pain Education --      Exclude from Growth Chart --    No data found.  Updated Vital Signs BP 133/86 (BP Location: Left Arm)    Pulse 82   Temp 98.8 F (37.1 C) (Oral)   Resp 16   LMP 04/25/2023   SpO2 98%   Visual Acuity Right Eye Distance:   Left Eye Distance:   Bilateral Distance:    Right Eye Near:   Left Eye Near:    Bilateral Near:     Physical Exam General appearance: Alert, Ill-appearing, no distress Head: Normocephalic, without obvious abnormality, atraumatic ENT: mucosal edema, congestion, erythematous oropharynx w/o exudate Heart: Rate and Rhythm normal. No gallop or murmurs. Respiratory: Respirations even , unlabored, coarse lung sound, wheezing bilateral upper lung fields  Extremities: No gross deformities Skin: Skin color, texture, turgor normal. No rashes seen  Neurologic:No obvious neurological deficits  UC Treatments / Results  Labs (all labs ordered are listed, but only abnormal results are displayed) Labs Reviewed  POCT URINE PREGNANCY    EKG   Radiology DG Chest 2 View  Result Date: 05/20/2023 CLINICAL DATA:  Shortness of breath, wheezing, productive cough, pain for 2 weeks EXAM: CHEST - 2 VIEW COMPARISON:  CTA chest and chest radiograph 10/09/2021 FINDINGS: The cardiomediastinal silhouette is normal There is no focal consolidation or pulmonary edema. There is no pleural effusion or pneumothorax There is no acute osseous abnormality. IMPRESSION: No radiographic evidence of acute cardiopulmonary process. Electronically Signed   By: Lesia Hausen M.D.   On: 05/20/2023 13:14    Procedures Procedures (including critical care time)  Medications Ordered in UC Medications  albuterol (VENTOLIN HFA) 108 (90 Base) MCG/ACT inhaler 2 puff (2 puffs Inhalation Given 05/20/23 1123)    Initial Impression / Assessment and Plan / UC Course  I have reviewed the triage vital signs and the nursing notes.  Pertinent labs & imaging results that were available during my care of the patient were reviewed by me and considered in my medical decision making (see chart for details).    Chest x-ray  unremarkable. Given recent two rounds of antibiotics and prednisone, will opt against restarting any antibiotic treatment. Symptoms management warranted only, per discharge medications orders. Nebulizer machine dispensed from clinic. Return precautions given. Patient verbalized understanding and agreement with plan. Final Clinical Impressions(s) / UC Diagnoses  Final diagnoses:  Atypical chest pain  Acute bronchitis, unspecified organism     Discharge Instructions      We will notify you of your chest x-ray results. You can perform DuoNebs every 6 hours as needed for chest tightness and shortness of breath.  Albuterol 2 puffs every 4- 6 hours as needed for chest tightness or wheezing and unable to perform DuoNebs.  Reviewed your EKG showed no can concerning findings suspect all symptoms are related to bronchitis.  Promethazine DM prescribed for cough.  If your chest x-ray shows any infection we will prescribe a different antibiotic.  Continue with treatment plan as prescribed.   ED Prescriptions     Medication Sig Dispense Auth. Provider   ipratropium-albuterol (DUONEB) 0.5-2.5 (3) MG/3ML SOLN Take 3 mLs by nebulization every 6 (six) hours as needed. 360 mL Bing Neighbors, NP   albuterol (VENTOLIN HFA) 108 (90 Base) MCG/ACT inhaler Inhale 2 puffs into the lungs every 6 (six) hours as needed for wheezing or shortness of breath. -- Bing Neighbors, NP   promethazine-dextromethorphan (PROMETHAZINE-DM) 6.25-15 MG/5ML syrup Take 5 mLs by mouth 3 (three) times daily as needed (cough and upper respiratory symptoms). 240 mL Bing Neighbors, NP      PDMP not reviewed this encounter.   Bing Neighbors, NP 05/21/23 2154

## 2023-09-05 ENCOUNTER — Encounter: Payer: Self-pay | Admitting: Endocrinology

## 2023-09-05 ENCOUNTER — Ambulatory Visit: Payer: 59 | Admitting: Endocrinology

## 2023-09-05 VITALS — BP 122/80 | HR 94 | Resp 20 | Ht 65.0 in | Wt 154.0 lb

## 2023-09-05 DIAGNOSIS — R7989 Other specified abnormal findings of blood chemistry: Secondary | ICD-10-CM

## 2023-09-05 NOTE — Progress Notes (Signed)
Outpatient Endocrinology Note [place] Kaytlen Lightsey, MD   Patient's Name: Deanna Wong    DOB: 07-May-1991    MRN: [id number]  REASON OF VISIT: New consult for elevated 17 hydroxyprogesterone.  REFERRING PROVIDER: Milas Hock, MD  PCP:  [facility]  HISTORY OF PRESENT ILLNESS:   Deanna Wong is a 32 y.o. 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  Pertinent history: Patient was evaluated for oligomenorrhea in July 2024 by OB/GYN had laboratory workup and found to have elevated 17 hydroxyprogesterone.  She is referred to endocrinology with a concern of nonclassic congenital adrenal hyperplasia.  Patient had complaints of spontaneous nipple discharge with breast engorgement, when evaluated in July by OB/GYN, she was not having menstrual cycle since April 2024.  She used to have regular monthly menstrual cycle prior to that.  She had negative serum and urine pregnancy tests during that time.  In July, 2024 she had mildly elevated prolactin of 33.9 with upper normal limit of 33.4.  She had elevated 17 hydroxyprogesterone 486, ~ 2 times normal and elevated estradiol.  She had normal LH and FSH.  Normal testosterone level.  Normal thyroid function test.   Latest Reference Range & Units 12/14/22 14:41 02/02/23 10:35 03/16/23 15:43  LH mIU/mL   12.7  FSH mIU/mL   6.1  Prolactin 4.8 - 33.4 ng/mL   33.9 (H)  Estradiol pg/mL   1,344.0  hCG Quant mIU/mL <1 <1   Progesterone ng/mL   1.8  17-OH Progesterone LCMS ng/dL   409  (H): Data is abnormally high    Latest Reference Range & Units 03/16/23 15:46  hCG Quant mIU/mL <1    Latest Reference Range & Units 03/16/23 15:44  Sex Horm Binding Glob, Serum 24.6 - 122.0 nmol/L 99.8  Testosterone 8 - 60 ng/dL 16  Testosterone Free 0.0 - 4.2 pg/mL 0.7     Latest Reference Range & Units 03/16/23  15:43  TSH 0.450 - 4.500 uIU/mL 2.840   Patient reports regular monthly menstrual cycle with heavy bleeding, LMP December 19.  She has not been using birth control method.  Her menarche at the age of 4.  She has been trying to get pregnant for about 1 year without success.  She has 2 children currently at age of teens.  She has complaints of clear discharge mainly from the right breast and breast engorgement without any breast lump.  She has occasional headache and blurred vision, denies peripheral vision loss.  She has complaints of fatigue and weight gain, gained about 30 pounds of weight in last few months.  Appetite is fair, denies loss of appetite.  No lightheadedness, dizziness.  Blood pressure normal.  She occasionally gets nausea and vomiting.  Denies abdominal pain.  No facial or no hirsutism.  No acne.  Family history of early menopause at the age of 49s in mother, maternal grandmother and great maternal grandmother.  # Imagings: Ultrasound transvaginal in July 2024 with no reported features of PCOS, she had simple right ovarian cyst.  CTA chest in February 2023 with no obvious adrenal mass, reviewed images.  MRI brain in June 2016 with normal pituitary gland, reviewed images.  Patient is accompanied by her husband in the clinic today.  REVIEW OF SYSTEMS:  As per history of present illness.   PAST MEDICAL HISTORY: Past Medical History:  Diagnosis Date   Anxiety  Depression    Ovarian cyst    PTSD (post-traumatic stress disorder)    Seizures (HCC)     PAST SURGICAL HISTORY: Past Surgical History:  Procedure Laterality Date   CESAREAN SECTION     tubes in ears      ALLERGIES: No Known Allergies  FAMILY HISTORY:  Family History  Problem Relation Age of Onset   Hypertension Mother    Stroke Mother    Kidney failure Mother    Breast cancer Maternal Aunt     SOCIAL HISTORY: Social History   Socioeconomic History   Marital status: Married    Spouse name: Not  on file   Number of children: Not on file   Years of education: Not on file   Highest education level: Not on file  Occupational History   Not on file  Tobacco Use   Smoking status: Every Day    Current packs/day: 0.50    Average packs/day: 0.5 packs/day for 10.0 years (5.0 ttl pk-yrs)    Types: Cigarettes   Smokeless tobacco: Never  Vaping Use   Vaping status: Never Used  Substance and Sexual Activity   Alcohol use: Yes    Comment: occ   Drug use: No   Sexual activity: Not Currently  Other Topics Concern   Not on file  Social History Narrative   Not on file   Social Drivers of Health   Financial Resource Strain: Not on file  Food Insecurity: Not on file  Transportation Needs: Not on file  Physical Activity: Not on file  Stress: Not on file  Social Connections: Unknown (01/19/2022)   Received from [facility]   Social Network    Social Network: Not on file    MEDICATIONS:  Current Outpatient Medications  Medication Sig Dispense Refill   clonazePAM (KLONOPIN) 0.5 MG tablet Take 0.5 mg by mouth daily as needed for anxiety.     FLUoxetine (PROZAC) 40 MG capsule Take 40 mg by mouth daily.     ibuprofen (ADVIL,MOTRIN) 200 MG tablet Take 200 mg by mouth every 6 (six) hours as needed for cramping.     albuterol (VENTOLIN HFA) 108 (90 Base) MCG/ACT inhaler Inhale 2 puffs into the lungs every 6 (six) hours as needed for wheezing or shortness of breath.     ipratropium-albuterol (DUONEB) 0.5-2.5 (3) MG/3ML SOLN Take 3 mLs by nebulization every 6 (six) hours as needed. 360 mL 0   Iron-Vitamin C (VITRON-C) 65-125 MG TABS Take 1 tablet by mouth daily. (Patient not taking: Reported on 03/16/2023) 90 tablet 0   medroxyPROGESTERone (PROVERA) 10 MG tablet Take 1 tablet (10 mg total) by mouth daily. Use for ten days 7 tablet 0   promethazine-dextromethorphan (PROMETHAZINE-DM) 6.25-15 MG/5ML syrup Take 5 mLs by mouth 3 (three) times daily as needed (cough and upper  respiratory symptoms). 240 mL 0   topiramate (TOPAMAX) 100 MG tablet Take 100 mg by mouth at bedtime.     No current facility-administered medications for this visit.    PHYSICAL EXAM: Vitals:   09/05/23 0818  BP: 122/80  Pulse: 94  Resp: 20  SpO2: 98%  Weight: 154 lb (69.9 kg)  Height: 5\' 5"  (1.651 m)   Body mass index is 25.63 kg/m.  Wt Readings from Last 3 Encounters:  09/05/23 154 lb (69.9 kg)  03/16/23 153 lb (69.4 kg)  12/14/22 154 lb (69.9 kg)       General: Well developed, well nourished female in no apparent distress. Appropriate  for age.  HEENT: AT/[place], no external lesions. Hearing intact to the spoken word Eyes: EOMI. Conjunctiva clear and no icterus.  Visual field by confrontation grossly intact. Neck: Trachea midline, neck supple without appreciable thyromegaly or lymphadenopathy and no palpable thyroid nodules Lungs: Clear to auscultation, no wheeze. Respirations not labored Heart: S1S2, Regular in rate and rhythm.  Abdomen: Soft, non tender, non distended, no striae.  Stretch mark present. Neurologic: Alert, oriented, normal speech, deep tendon biceps reflexes normal,  no gross focal neurological deficit Extremities: No pedal pitting edema, no tremors of outstretched hands Skin: Warm, color good.  No bruises. Psychiatric: Does not appear depressed or anxious  PERTINENT HISTORIC LABORATORY AND IMAGING STUDIES:  All pertinent laboratory results were reviewed. Please see HPI also for further details.   ASSESSMENT / PLAN  1. High serum 17-hydroxyprogesterone   2. Elevated prolactin level    -Patient was evaluated by OB/GYN due to oligomenorrhea in July 2024 noted to have high 17 hydroxyprogesterone ~  2 times upper limit of normal and mildly elevated prolactin.  She had elevated estradiol as well.  Pregnancy test was negative.  She has been trying to get pregnant without success for about 1 year. She had been pregnant in the past with 2 children at the age of  teens. -No features suggestive of hyperaldosteronism, no hirsutism, no acne.  No reported ultrasound finding of PCOS on ovaries,  testosterone levels was normal, findings overall negative for PCOS.  -Unclear reason for elevated 17 hydroxyprogesterone, with elevated estradiol, pregnancy test at that time was negative.  There is a possibility of pregnancy ( however had pregnancy test negative) vs non classic congenital adrenal hyperplasia (NCCAH). She has regular menstrual cycle, had become pregnant in the past and no features of hyperaldosteronism.  If NCCAH is present it could be milder form.  Plan: -Additional laboratory testings are required to establish or exclude the diagnosis. -Check 17 hydroxyprogesterone.  Her LMP on December 19, she is around end of follicular phase. If the level is more than 1500, will be diagnostic of congenital adrenal hyperplasia.  If the level is below 200 usually exclude the diagnosis.  If the level is in between [phone], additional confirmatory testing with ACTH stimulation test is required. -I would like to check cortisol, ACTH, and androstedione, DHEA-S, estradiol and BMP. -If NCCAH is present, glucocorticoid treatment can be considered for fertility.  In general for mild asymptomatic NCCAH does not need treatment or may be treated with OCPs, and glucocorticoid treatment is generally not required. -Advised to continue to follow-up with OB/GYN and consider reproductive endocrinology evaluation for infertility.  # Patient had mildly elevated prolactin / occasional galactorrhea -I like to check prolactin and macroprolactin level -Will check serum hCG for pregnancy test as well  Rest of the management plan based on these test results.   Diagnoses and all orders for this visit:  High serum 17-hydroxyprogesterone -     17-Hydroxyprogesterone -     Basic Metabolic Panel (BMET) -     Cortisol -     ACTH -     Estradiol -     Androstenedione -     DHEA-sulfate;  Future  Elevated prolactin level -     Prolactin -     Prolactin, Total and Monomeric -     hCG, serum, qualitative -     hCG, quantitative, pregnancy; Future    DISPOSITION Follow up in clinic in to be determined based on above plan.  All questions  answered and patient verbalized understanding of the plan.   [place] Vaughan Garfinkle, MD [facility] [address] [place], [address] [place], [place] [address] Phone # [phone]  At least part of this note was generated using voice recognition software. Inadvertent word errors may have occurred, which were not recognized during the proofreading process.

## 2023-09-12 ENCOUNTER — Encounter: Payer: Self-pay | Admitting: Endocrinology

## 2023-09-13 NOTE — Telephone Encounter (Signed)
  All the lab results are acceptable and normal, which means negative test results for non classic congenital adrenal hyperplasia [facility]), with 17-hydroxyprogesterone of 132.  No routine endocrinology follow-up is required.  Continue to follow-up with OB/GYN and reproductive endocrinology, for fertility.

## 2023-09-14 ENCOUNTER — Telehealth: Payer: Self-pay

## 2023-09-14 NOTE — Telephone Encounter (Signed)
 Patient given results  as directed by MD.

## 2023-09-14 NOTE — Telephone Encounter (Signed)
-----   Message from [place] Thapa sent at 09/13/2023 12:40 PM EST ----- Notify patient of:  Labs reviewed all results are normal and acceptable including 17 hydroxyprogesterone and estradiol .  She has normal prolactin level.  Pregnancy test negative.  Normal DHEA-S, cortisol and androstenedione related to adrenal gland.  Normal ACTH  level.  Overall reassuring lab results.  Negative test results for non classic congenital adrenal hyperplasia (NCCAH), with 17-hydroxyprogesterone of 132.  No routine endocrinology follow-up is required.  Continue to follow-up with OB/GYN and reproductive endocrinology, for fertility.

## 2023-09-18 LAB — PROLACTIN, TOTAL AND MONOMERIC
Prolactin, Monomeric: 7.8 ng/mL (ref 3.2–25.2)
Prolactin, Total: 10 ng/mL

## 2023-09-18 LAB — BASIC METABOLIC PANEL
BUN/Creatinine Ratio: 13 (calc) (ref 6–22)
BUN: 13 mg/dL (ref 7–25)
CO2: 25 mmol/L (ref 20–32)
Calcium: 9.8 mg/dL (ref 8.6–10.2)
Chloride: 101 mmol/L (ref 98–110)
Creat: 1.02 mg/dL — ABNORMAL HIGH (ref 0.50–0.97)
Glucose, Bld: 75 mg/dL (ref 65–99)
Potassium: 3.7 mmol/L (ref 3.5–5.3)
Sodium: 140 mmol/L (ref 135–146)

## 2023-09-18 LAB — PROLACTIN: Prolactin: 8.5 ng/mL

## 2023-09-18 LAB — ANDROSTENEDIONE: Androstenedione: 101 ng/dL

## 2023-09-18 LAB — TEST AUTHORIZATION

## 2023-09-18 LAB — HCG, TOTAL, QUANTITATIVE: hCG, Beta Chain, Quant, S: <5 m[IU]/mL

## 2023-09-18 LAB — 17-HYDROXYPROGESTERONE: 17-OH-Progesterone, LC/MS/MS: 132 ng/dL

## 2023-09-18 LAB — ESTRADIOL: Estradiol: 138 pg/mL

## 2023-09-18 LAB — CORTISOL: Cortisol, Plasma: 18.6 ug/dL

## 2023-09-18 LAB — DHEA-SULFATE: DHEA-SO4: 52 ug/dL (ref 19–237)

## 2023-09-18 LAB — ACTH: C206 ACTH: 15 pg/mL (ref 6–50)

## 2023-10-01 IMAGING — CT CT ANGIO CHEST
2 of 6 series · 18 of 36 positions shown · IV contrast (Omnipaque or Isovue)
Comparison: None.

CLINICAL DATA: Shortness of breath and chest pain. Difficulty
swallowing

EXAM:
CT ANGIOGRAPHY CHEST WITH CONTRAST
TECHNIQUE: Multidetector CT imaging of the chest was performed using the
standard protocol during bolus administration of intravenous
contrast. Multiplanar CT image reconstructions and MIPs were
obtained to evaluate the vascular anatomy.

[Series 6: pe axial thins · axial · 0.63mm/px · z∈[+1108,+1396]mm · 17 of 398 slices shown]
[im 19/398  lung]
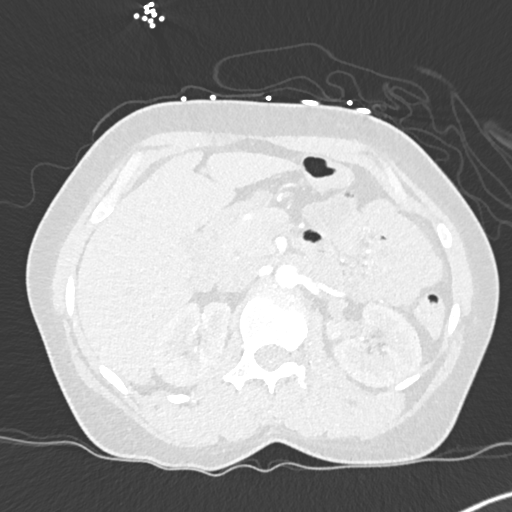
[im 38/398  mediastinal]
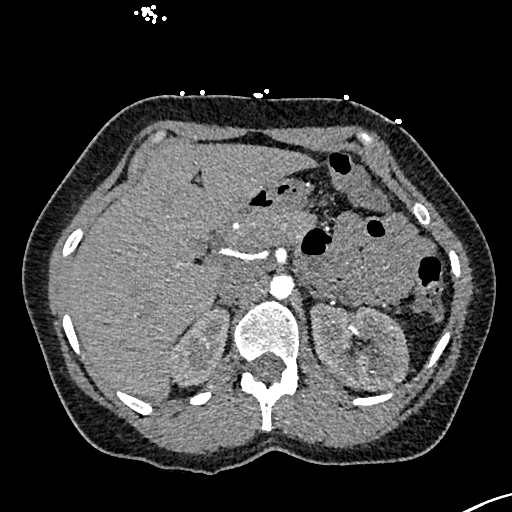
[im 57/398  lung]
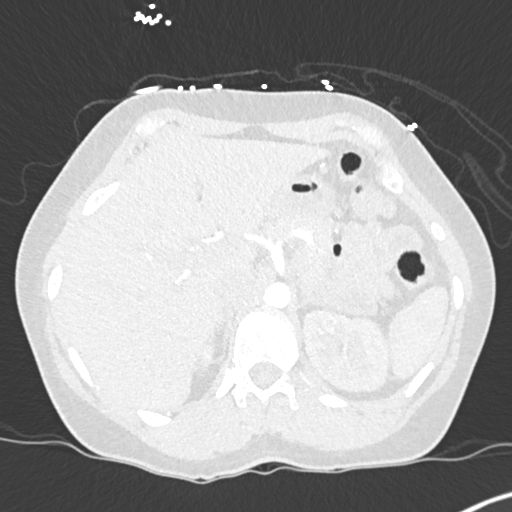
[im 95/398  mediastinal]
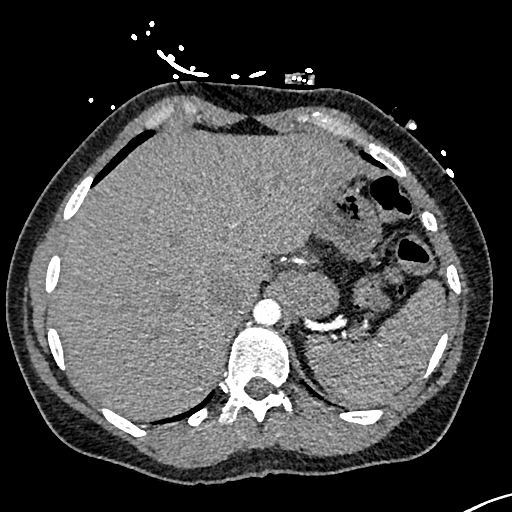
[im 114/398  lung]
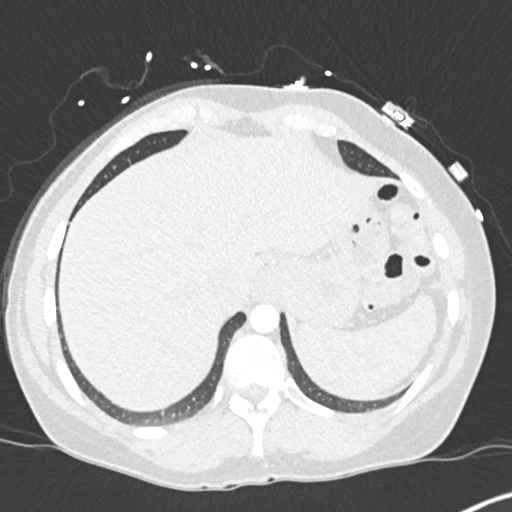
[im 133/398  mediastinal]
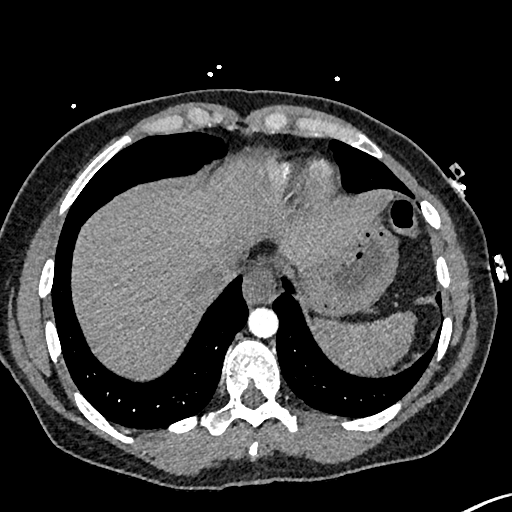
[im 152/398  lung]
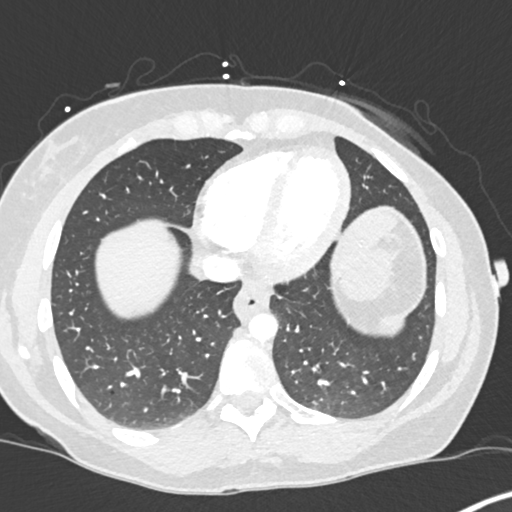
[im 171/398  mediastinal]
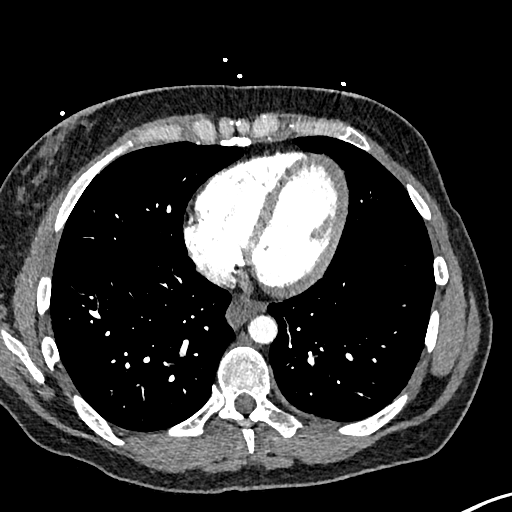
[im 208/398  lung]
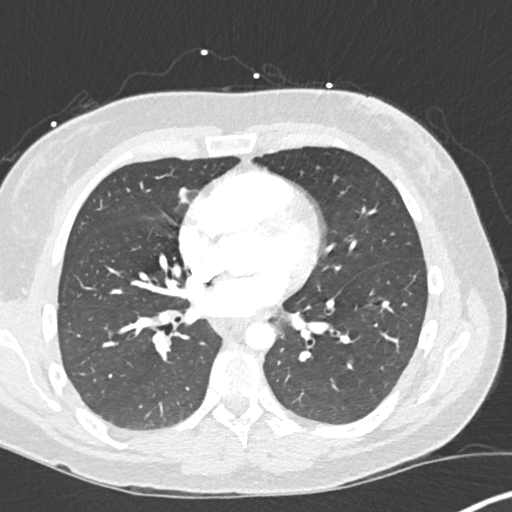
[im 227/398  mediastinal]
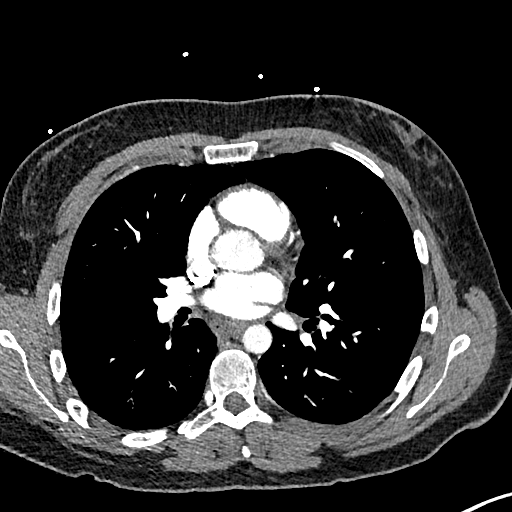
[im 246/398  lung]
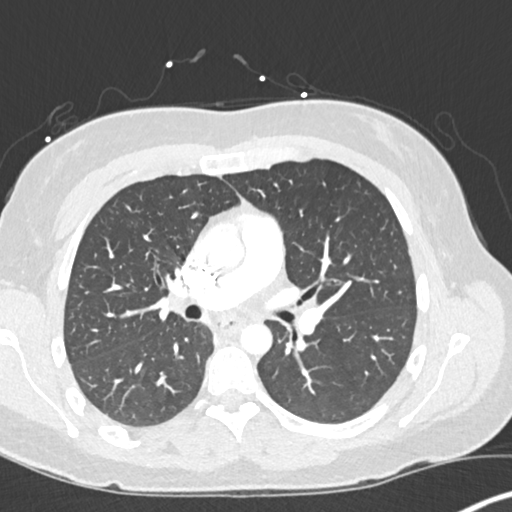
[im 265/398  mediastinal]
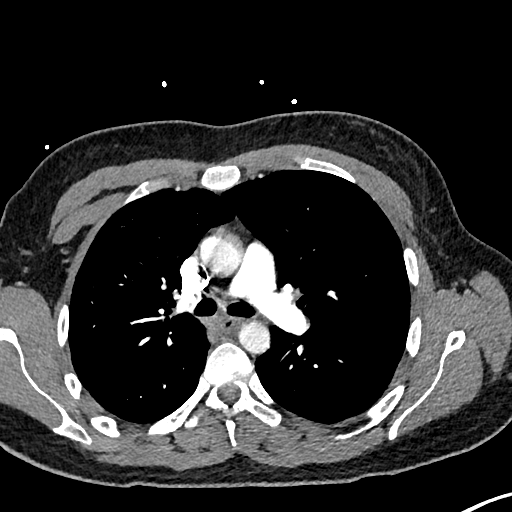
[im 284/398  lung]
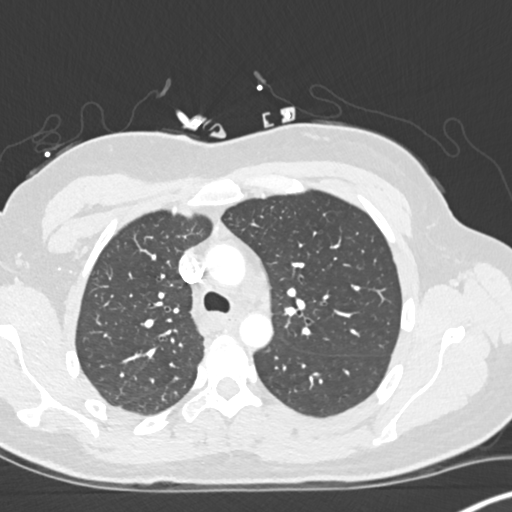
[im 303/398  mediastinal]
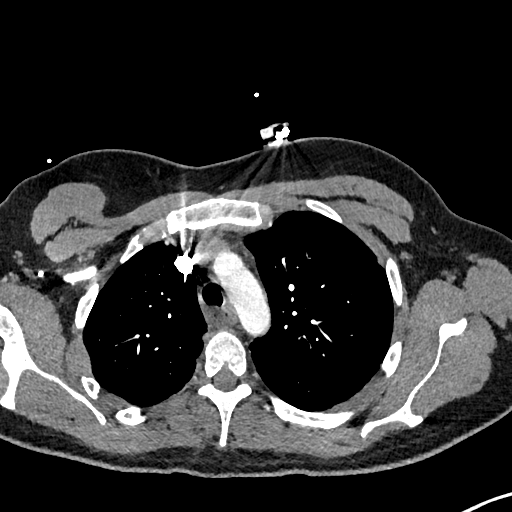
[im 341/398  lung]
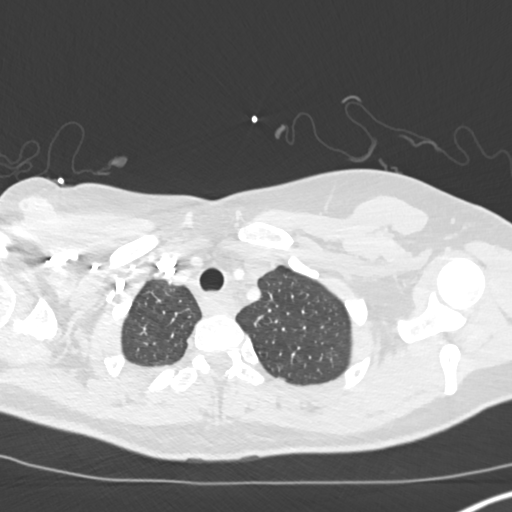
[im 360/398  mediastinal]
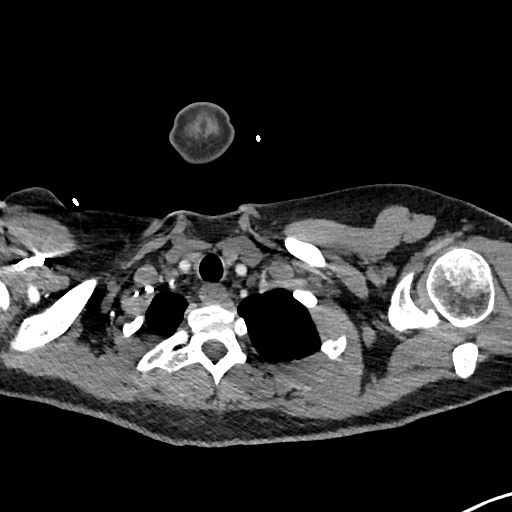
[im 379/398  lung]
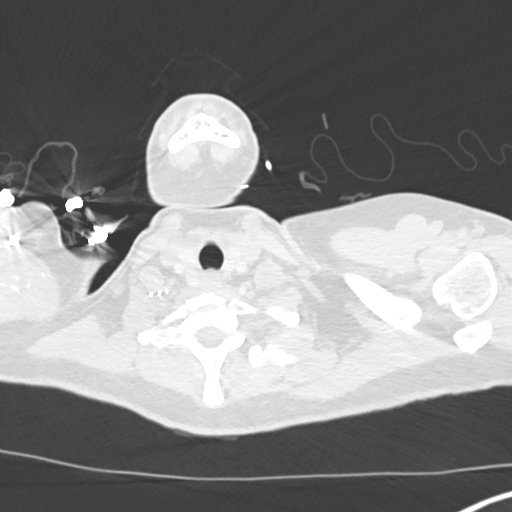

[Series 8: cor soft · coronal · 0.64mm/px · 1 of 144 slices shown]
[im 72/144  mediastinal]
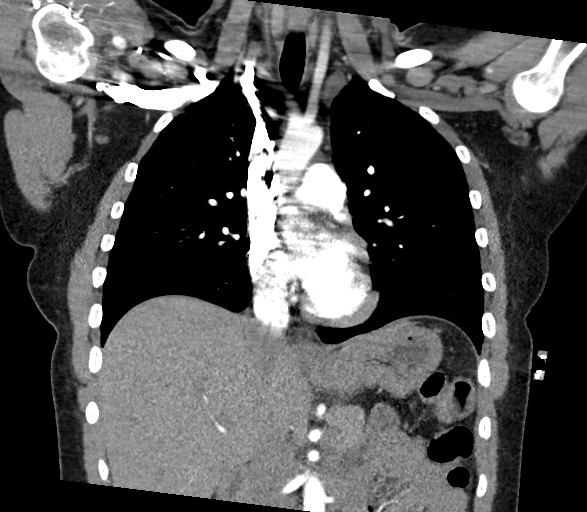

[18 of 36 positions shown; findings below may reference images not displayed]

RADIATION DOSE REDUCTION: This exam was performed according to the
departmental dose-optimization program which includes automated
exposure control, adjustment of the mA and/or kV according to
patient size and/or use of iterative reconstruction technique.

CONTRAST:  75mL OMNIPAQUE IOHEXOL 350 MG/ML SOLN
FINDINGS: Cardiovascular: No pulmonary embolism identified. Heart size is
normal. No pericardial effusion visualized. Main pulmonary artery is
normal caliber. Thoracic aorta is normal in course and caliber.

Mediastinum/Nodes: Mildly enlarged right hilar lymph node measuring
11 mm. Wall thickening of the esophagus most prominent distally.

Lungs/Pleura: No focal consolidation identified. Small irregular
density in the right upper lobe anteriorly likely represents
scarring or subsegmental atelectasis. Note is made of a 3 mm
pleural-based nodular density in the right upper lobe image 48. No
pleural effusion or pneumothorax.

Upper Abdomen: No acute process identified in the upper abdomen.

Musculoskeletal: No chest wall abnormality. No acute or significant
osseous findings.

Review of the MIP images confirms the above findings.
IMPRESSION: 1. No pulmonary embolism identified.
2. Esophageal wall thickening most significant distally. Correlate
for possible esophagitis/reflux and consider follow-up with
GI/endoscopy as indicated.
3. Mildly enlarged right hilar lymph node, nonspecific.
4. Small density in the right upper lobe likely represents scarring
or subsegmental atelectasis.

## 2023-11-08 IMAGING — US US PELVIS COMPLETE WITH TRANSVAGINAL
1 series · 14 of 25 positions shown · non-contrast
Comparison: 06/24/2014

CLINICAL DATA: Abnormal uterine bleeding, nonvisualization of the
IUD string.

EXAM:
TRANSABDOMINAL AND TRANSVAGINAL ULTRASOUND OF PELVIS
TECHNIQUE: Both transabdominal and transvaginal ultrasound examinations of the
pelvis were performed. Transabdominal technique was performed for
global imaging of the pelvis including uterus, ovaries, adnexal
regions, and pelvic cul-de-sac. It was necessary to proceed with
endovaginal exam following the transabdominal exam to visualize the
uterus and adnexae in better detail.

[Series 1: us pelvic complete with transvaginal · 14 of 88 slices shown]
[im 1/88]
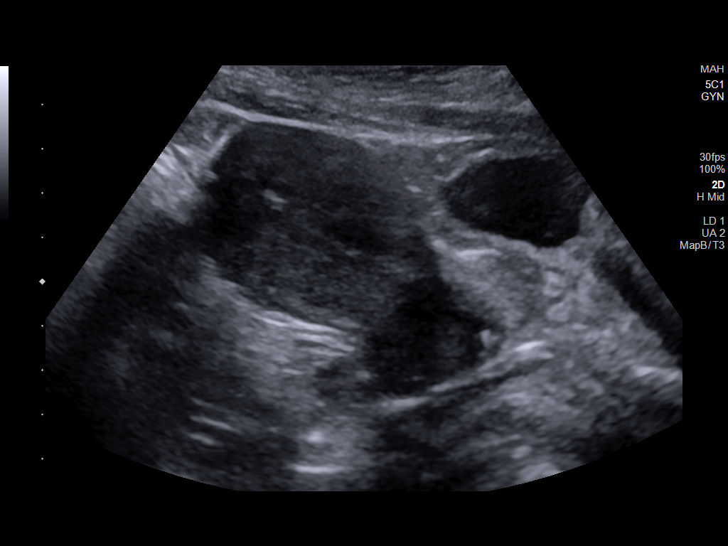
[im 8/88]
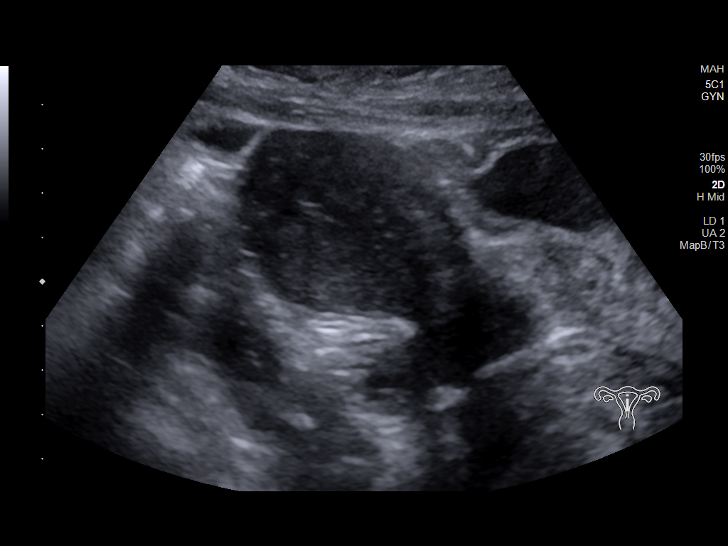
[im 15/88]
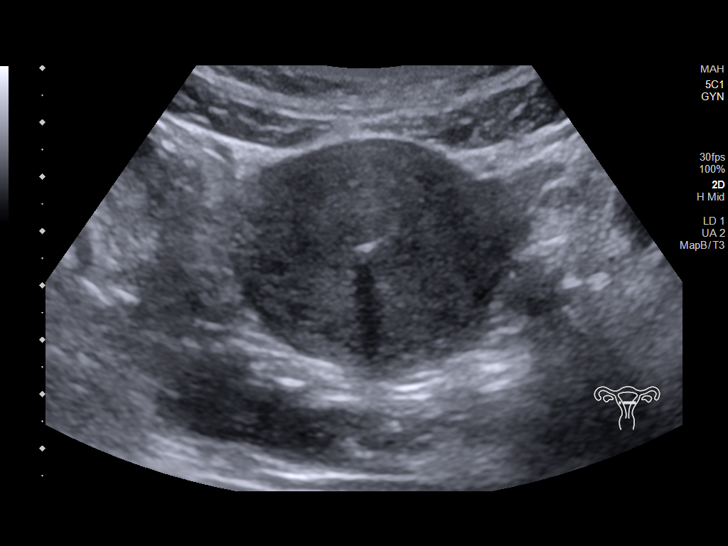
[im 22/88]
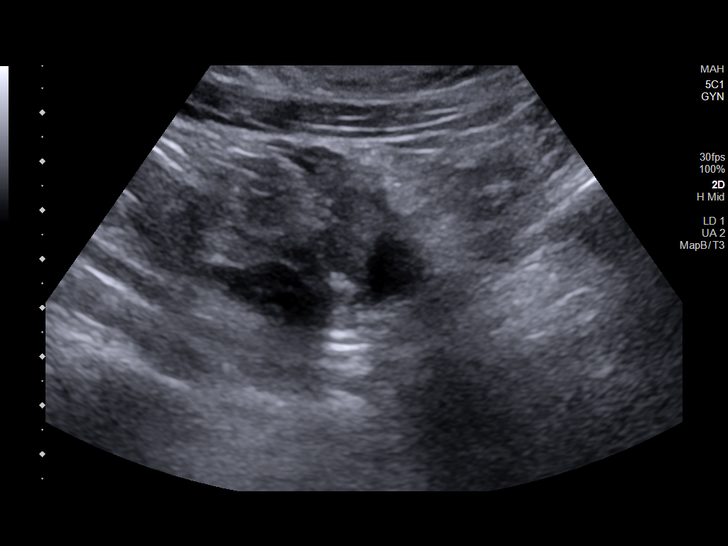
[im 30/88]
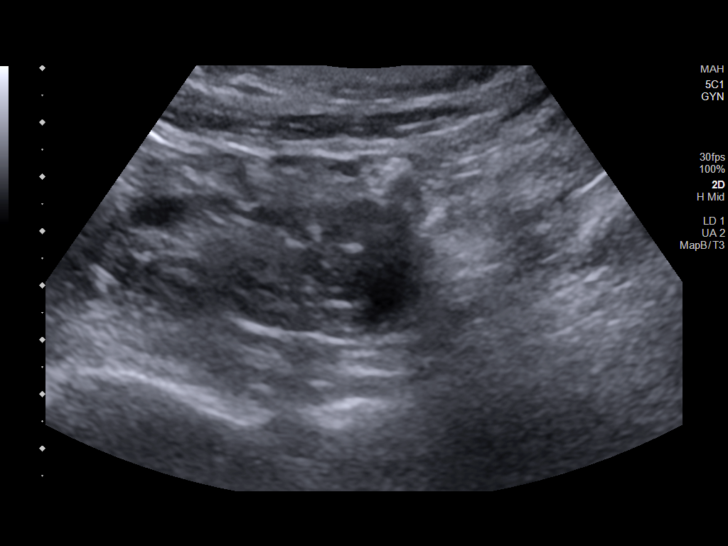
[im 33/88]
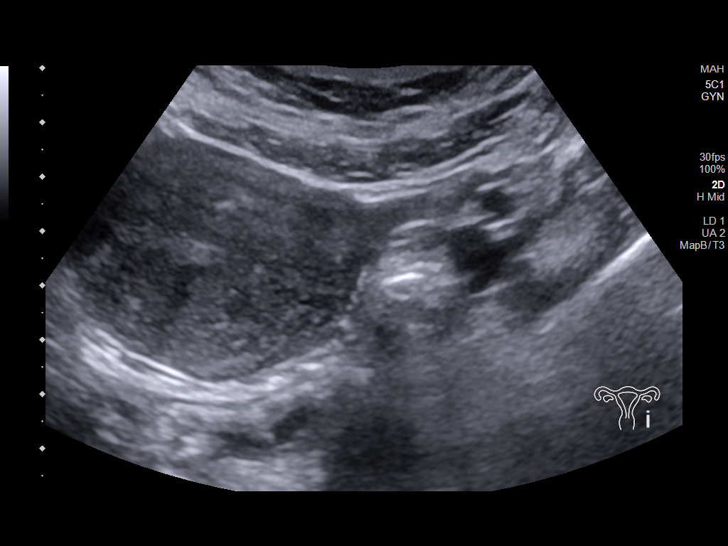
[im 40/88]
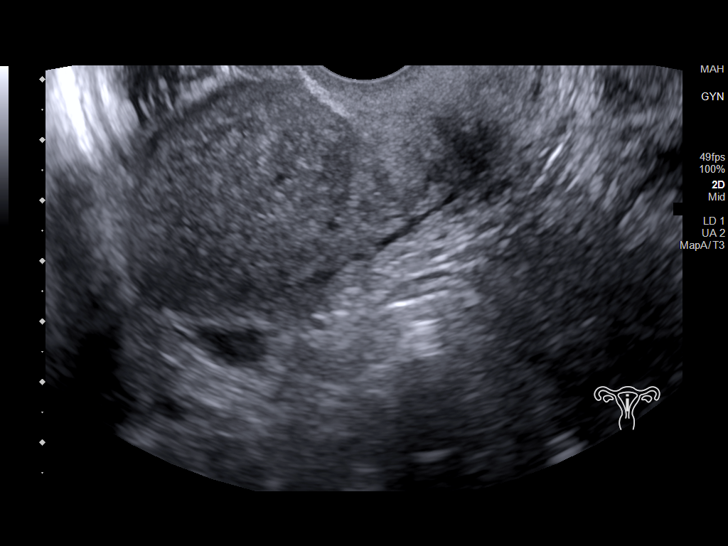
[im 48/88]
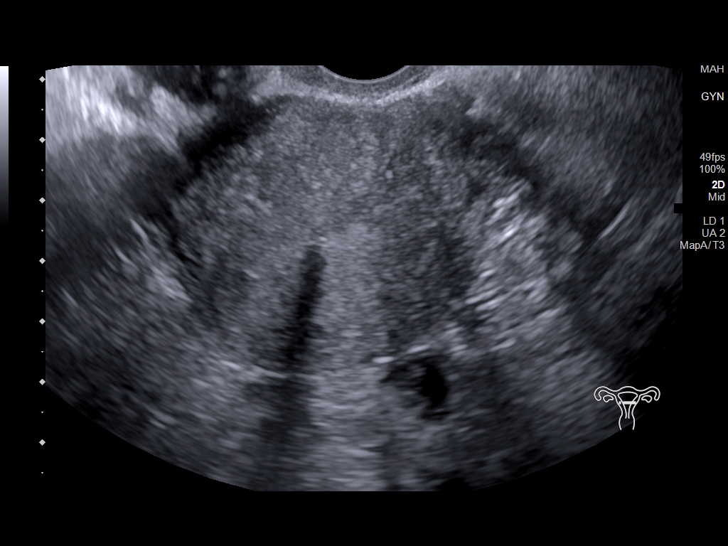
[im 55/88]
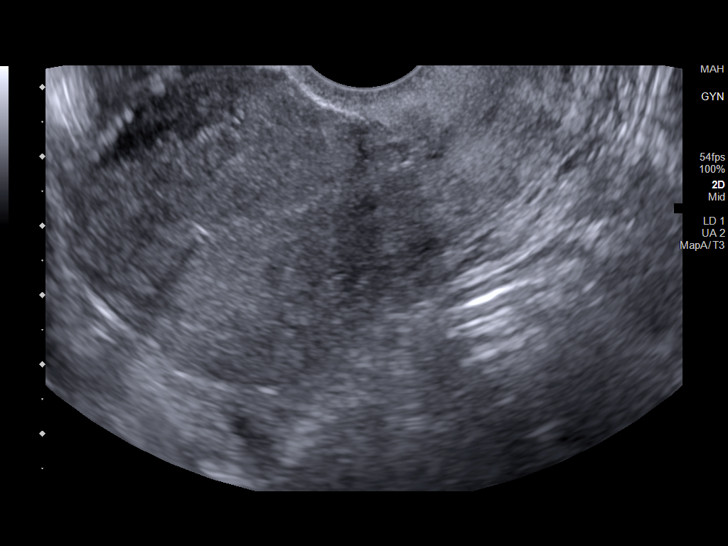
[im 59/88]
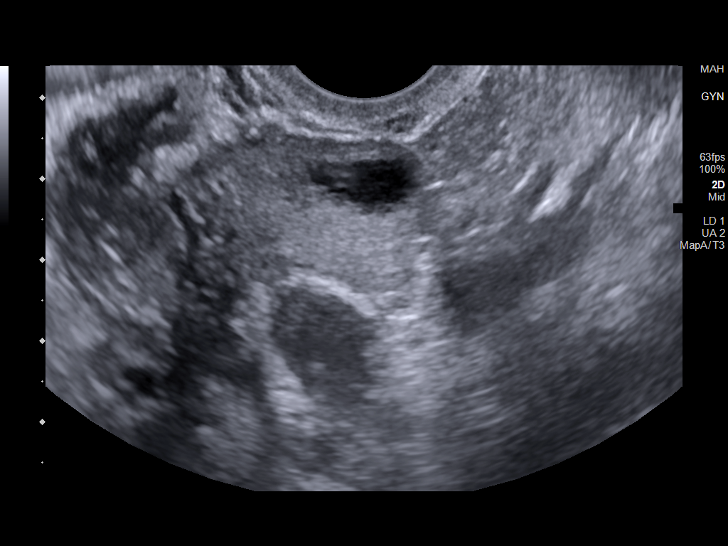
[im 66/88]
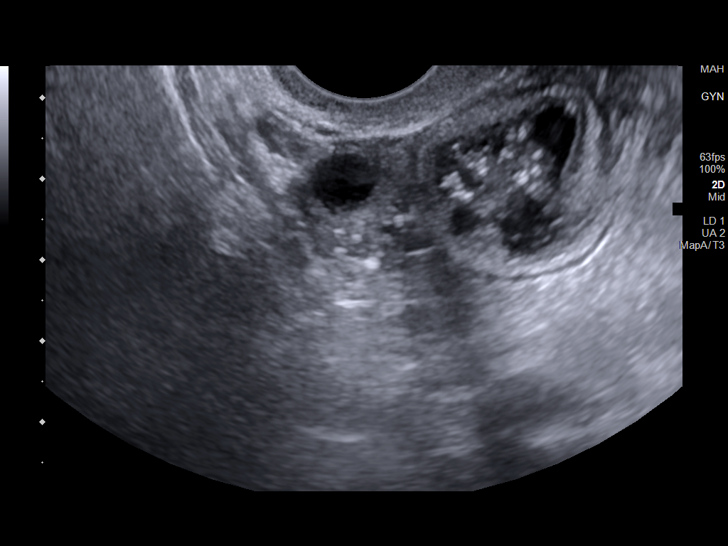
[im 73/88]
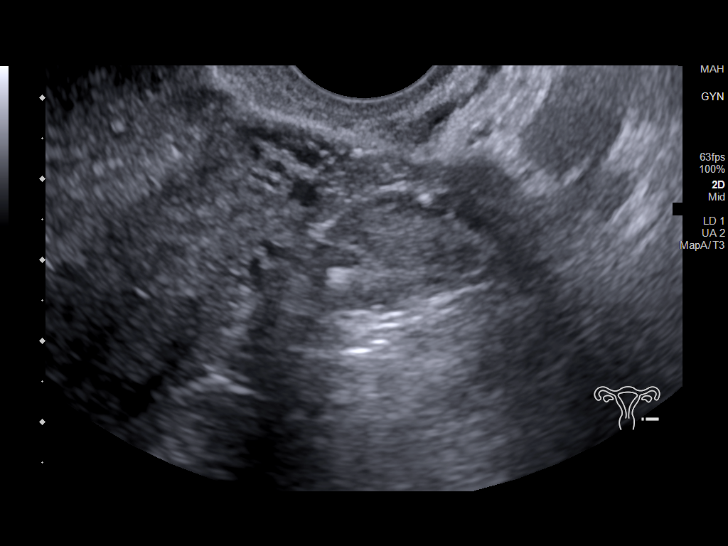
[im 80/88]
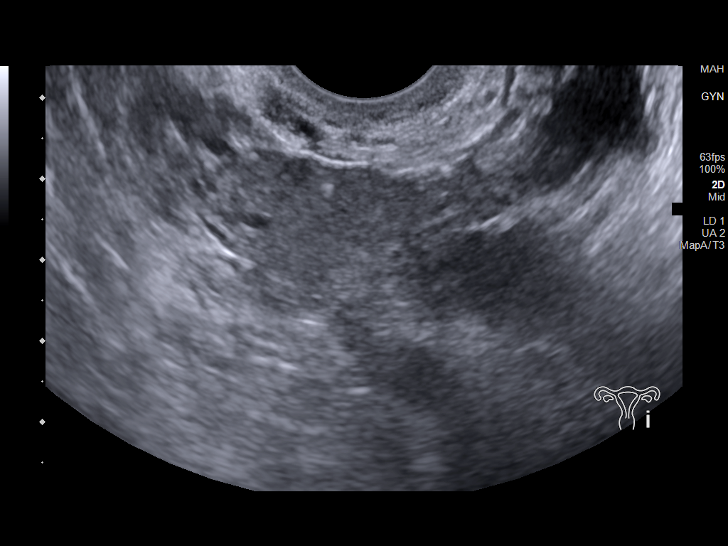
[im 88/88]
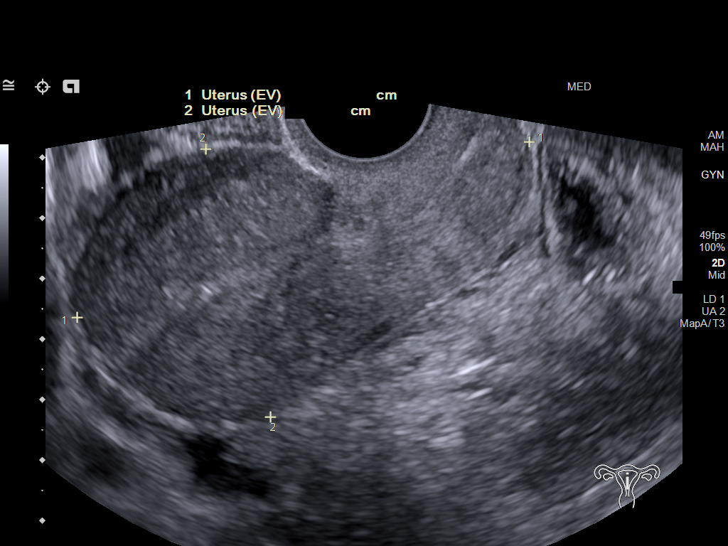

[14 of 25 positions shown; findings below may reference images not displayed]

FINDINGS: Uterus

Measurements: 7.7 x 4.2 x 5.5 cm = volume: 93 mL. No fibroids or
other mass visualized.

Endometrium

Thickness: 5.7 mm. Echogenic shadowing IUD appears appropriately
positioned within the endometrial cavity.

Right ovary

Measurements: 3.1 x 1.6 x 2.6 cm = volume: 6.6 mL. Normal
appearance/no adnexal mass.

Left ovary

Measurements: 4.3 x 1.6 x 1.5 cm = volume: 5.3 mL. Normal
appearance/no adnexal mass.

Other findings

No abnormal free fluid.
IMPRESSION: IUD appears appropriately position within the endometrial cavity.

No other significant or acute finding by pelvic ultrasound.

## 2023-11-21 ENCOUNTER — Encounter: Payer: Self-pay | Admitting: Family Medicine

## 2023-11-21 ENCOUNTER — Ambulatory Visit: Admitting: Family Medicine

## 2023-11-21 VITALS — BP 156/93 | HR 69 | Temp 98.1°F | Ht 65.0 in | Wt 160.0 lb

## 2023-11-21 DIAGNOSIS — F445 Conversion disorder with seizures or convulsions: Secondary | ICD-10-CM

## 2023-11-21 DIAGNOSIS — Z7689 Persons encountering health services in other specified circumstances: Secondary | ICD-10-CM

## 2023-11-21 DIAGNOSIS — R030 Elevated blood-pressure reading, without diagnosis of hypertension: Secondary | ICD-10-CM | POA: Diagnosis not present

## 2023-11-21 DIAGNOSIS — F411 Generalized anxiety disorder: Secondary | ICD-10-CM | POA: Diagnosis not present

## 2023-11-21 DIAGNOSIS — R42 Dizziness and giddiness: Secondary | ICD-10-CM | POA: Diagnosis not present

## 2023-11-21 DIAGNOSIS — R252 Cramp and spasm: Secondary | ICD-10-CM

## 2023-11-21 DIAGNOSIS — R631 Polydipsia: Secondary | ICD-10-CM

## 2023-11-21 DIAGNOSIS — F17200 Nicotine dependence, unspecified, uncomplicated: Secondary | ICD-10-CM

## 2023-11-21 MED ORDER — TOPIRAMATE 25 MG PO CPSP
ORAL_CAPSULE | ORAL | 0 refills | Status: DC
Start: 2023-11-21 — End: 2023-12-19

## 2023-11-21 NOTE — Progress Notes (Signed)
 New Patient Office Visit  Subjective   Patient ID: Deanna Wong, female    DOB: [birth year]  Age: 33 y.o. MRN: [id number]  CC:  Chief Complaint  Patient presents with   Establish Care    Pt. Here to establish care.    Muscle Pain    Pt. C/o muscles tenderness to touch when waking up in the morning.    HPI Deanna Wong is a 33 year old female/female who presents to establish with [facility]: [facility].   CC: Patient here to establish care  Last PCP: about 6 years ago  Specialists: OBGYN, psychiatry, neurology   Muscle cramping/pain: Yesterday, every muscle was tender, both side of arms  Tender to the touch. Raise her arms hurt. Arms hurt and feet were tingling.  Reports nausea.   PNES: Near syncopal episode- twice in the last 2 week. Head feels very heavy, cannot open eyes. Difficulty with speech due to locking of her jaw. Reports he goes unconscious. Denies tachycardia, heart palpitations, chest pain, shortness of breath.  Saturday morning- told her husband she was going to pass out and then woke up hours later.  Was seeing [facility] neurology Seizure-like activity on 3/7 & 3/16: [facility] neuro told her that it was trauma induced. Does note an increase in life stressors recently. She did EEG at home and in the hospital- non-epileptic.   Anxiety/depression- husband tried to commit suicide 2 weeks ago  Therapy: monthly, psychiatry- currently taking prozac & clonazepam   Current smoker: elevated blood pressure is new for her       11/21/2023    8:54 AM  GAD 7 : Generalized Anxiety Score  Nervous, Anxious, on Edge 3  Control/stop worrying 1  Worry too much - different things 1  Trouble relaxing 3  Restless 3  Easily annoyed or irritable 1  Afraid - awful might happen 3  Total GAD 7 Score 15  Anxiety Difficulty Somewhat difficult       11/21/2023    8:52 AM  PHQ9 SCORE ONLY  PHQ-9 Total Score 9     Outpatient Encounter Medications as of 11/21/2023   Medication Sig   clonazePAM (KLONOPIN) 0.5 MG tablet Take 0.5 mg by mouth daily as needed for anxiety.   FLUoxetine (PROZAC) 40 MG capsule Take 40 mg by mouth daily.   ibuprofen (ADVIL,MOTRIN) 200 MG tablet Take 200 mg by mouth every 6 (six) hours as needed for cramping.   topiramate (TOPAMAX) 25 MG capsule Take 25mg  (1 tablet) nightly for 7 days. Take 50mg  (2 tablets) nightly for the next 7 days and continue taking 50mg . Take 75mg  (3 tablets) nightly for the next week. Take 100mg  (4 tablets) for the last week.   [DISCONTINUED] albuterol (VENTOLIN HFA) 108 (90 Base) MCG/ACT inhaler Inhale 2 puffs into the lungs every 6 (six) hours as needed for wheezing or shortness of breath.   [DISCONTINUED] ipratropium-albuterol (DUONEB) 0.5-2.5 (3) MG/3ML SOLN Take 3 mLs by nebulization every 6 (six) hours as needed.   [DISCONTINUED] Iron-Vitamin C (VITRON-C) 65-125 MG TABS Take 1 tablet by mouth daily. (Patient not taking: Reported on 03/16/2023)   [DISCONTINUED] medroxyPROGESTERone (PROVERA) 10 MG tablet Take 1 tablet (10 mg total) by mouth daily. Use for ten days   [DISCONTINUED] promethazine-dextromethorphan (PROMETHAZINE-DM) 6.25-15 MG/5ML syrup Take 5 mLs by mouth 3 (three) times daily as needed (cough and upper respiratory symptoms).   [DISCONTINUED] topiramate (TOPAMAX) 100 MG tablet Take 100 mg by mouth at bedtime.   No  facility-administered encounter medications on file as of 11/21/2023.    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   Migraine syndrome 06/22/2017   Anxiety disorder 06/10/2015   Past Medical History:  Diagnosis Date   Anxiety    Depression    Ovarian cyst    PTSD (post-traumatic stress disorder)    Seizures (HCC)    Past Surgical History:  Procedure Laterality Date   CESAREAN SECTION     tubes in ears     Family History  Problem Relation Age of Onset   Hypertension Mother    Stroke Mother    Kidney failure Mother     Breast cancer Maternal Aunt    Social History   Socioeconomic History   Marital status: Married    Spouse name: Not on file   Number of children: Not on file   Years of education: Not on file   Highest education level: Not on file  Occupational History   Not on file  Tobacco Use   Smoking status: Every Day    Current packs/day: 0.50    Average packs/day: 0.5 packs/day for 10.0 years (5.0 ttl pk-yrs)    Types: Cigarettes   Smokeless tobacco: Never  Vaping Use   Vaping status: Never Used  Substance and Sexual Activity   Alcohol use: Yes    Comment: occ   Drug use: No   Sexual activity: Not Currently  Other Topics Concern   Not on file  Social History Narrative   Not on file   Social Drivers of Health   Financial Resource Strain: Not on file  Food Insecurity: Not on file  Transportation Needs: Not on file  Physical Activity: Not on file  Stress: Not on file  Social Connections: Unknown (01/19/2022)   Received from [facility]   Social Network    Social Network: Not on file  Intimate Partner Violence: Unknown (12/11/2021)   Received from [facility], [facility]   HITS    Physically Hurt: Not on file    Insult or Talk Down To: Not on file    Threaten Physical Harm: Not on file    Scream or Curse: Not on file   Outpatient Medications Prior to Visit  Medication Sig Dispense Refill   clonazePAM (KLONOPIN) 0.5 MG tablet Take 0.5 mg by mouth daily as needed for anxiety.     FLUoxetine (PROZAC) 40 MG capsule Take 40 mg by mouth daily.     ibuprofen (ADVIL,MOTRIN) 200 MG tablet Take 200 mg by mouth every 6 (six) hours as needed for cramping.     albuterol (VENTOLIN HFA) 108 (90 Base) MCG/ACT inhaler Inhale 2 puffs into the lungs every 6 (six) hours as needed for wheezing or shortness of breath.     ipratropium-albuterol (DUONEB) 0.5-2.5 (3) MG/3ML SOLN Take 3 mLs by nebulization every 6 (six) hours as needed. 360 mL 0   Iron-Vitamin C (VITRON-C)  65-125 MG TABS Take 1 tablet by mouth daily. (Patient not taking: Reported on 03/16/2023) 90 tablet 0   medroxyPROGESTERone (PROVERA) 10 MG tablet Take 1 tablet (10 mg total) by mouth daily. Use for ten days 7 tablet 0   promethazine-dextromethorphan (PROMETHAZINE-DM) 6.25-15 MG/5ML syrup Take 5 mLs by mouth 3 (three) times daily as needed (cough and upper respiratory symptoms). 240 mL 0   topiramate (TOPAMAX) 100 MG tablet Take 100 mg by mouth at bedtime.     No facility-administered medications prior  to visit.   No Known Allergies  ROS   Objective  Today's Vitals   11/21/23 0850 11/21/23 0936  BP: (!) 151/102 (!) 156/93  Pulse: 69   Temp: 98.1 F (36.7 C)   TempSrc: Oral   SpO2: 94%   Weight: 160 lb (72.6 kg)   Height: 5\' 5"  (1.651 m)   PainSc: 0-No pain    GENERAL: Well-appearing, in NAD. Well nourished.  SKIN: Pink, warm and dry. No rash, lesion, ulceration, or ecchymoses.  Head: Normocephalic. NECK: Trachea midline. Full ROM w/o pain or tenderness. No lymphadenopathy.  EARS: Tympanic membranes are intact, translucent without bulging and without drainage. Appropriate landmarks visualized.  EYES: Conjunctiva clear without exudates. EOMI, PERRL, no drainage present.  NOSE: Septum midline w/o deformity. Nares patent, mucosa pink and non-inflamed w/o drainage. No sinus tenderness.  THROAT: Uvula midline. Oropharynx clear. Tonsils non-inflamed without exudate. Mucous membranes pink and moist.  RESPIRATORY: Chest wall symmetrical. Respirations even and non-labored. Breath sounds clear to auscultation bilaterally.  CARDIAC: S1, S2 present, regular rate and rhythm without murmur or gallops. Peripheral pulses 2+ bilaterally.  MSK: Muscle tone and strength appropriate for age. Joints w/o tenderness, redness, or swelling.  EXTREMITIES: Without clubbing, cyanosis, or edema.  NEUROLOGIC: No motor or sensory deficits. Steady, even gait. C2-C12 intact.  PSYCH/MENTAL STATUS: Alert, oriented x  3. Cooperative, appropriate mood and affect.     Assessment & Plan:   1. Encounter to establish care (Primary) Patient is a 33 year old female who presents today to establish care with primary care at [facility]. Reviewed the past medical history, family history, social history, surgical history, medications and allergies today- updates made as indicated. Patient has concerns today about recent seizure-like activity and muscle pain.    2. Generalized anxiety disorder GAD-7 completed with a score of 15.  Patient reports she is currently taking Prozac 40mg  daily and clonazepam 0.5mg  up to twice daily as needed. She is followed by an Scientist, clinical (histocompatibility and immunogenetics). She would like a referral to a [facility] psychiatry.  Referral placed. - Ambulatory referral to Psychiatry  3. Psychogenic nonepileptic seizure Review of chart- looks like patient saw [facility] neurology last in 12/2021.  She reports experiencing an increase in PNES activity when her stress is increased.  She reports that her husband attempted suicide about 2 weeks ago.  Her last "episode" was on Saturday.  Reports numbness seizure-like activity happens she experiences severe jaw clenching, confusion, disorientation, and lightheadedness prior to this occurring. Denies facial weakness, one-sided weakness, changes to vision, heart palpitations/tachycardia, nausea/vomiting, chest pain, shortness of breath. Physical exam with no red flags present. Neurology exam normal. Normal gait, speech, answers all questions appropriately. Pulse, motor and sensation intact. CN2-12 intact. Referral placed for neurology- patient does not wish to return to [facility]. Discussed restarting Topamax and would like to gradually increase back to 100mg . Discussed slow weaning process back to tolerated dose of 100mg  daily. Follow-up in 4 weeks.  - Ambulatory referral to Psychiatry - Ambulatory referral to Neurology - topiramate (TOPAMAX) 25 MG capsule; Take 25mg  (1 tablet) nightly for 7  days. Take 50mg  (2 tablets) nightly for the next 7 days and continue taking 50mg . Take 75mg  (3 tablets) nightly for the next week. Take 100mg  (4 tablets) for the last week.  Dispense: 70 capsule; Refill: 0  4. Elevated blood pressure reading without diagnosis of hypertension Patient presents today with elevated blood pressure, repeat BP still elevated. Patient in no acute distress and is well-appearing. Denies chest pain, shortness of  breath, lower extremity edema, vision changes, headaches. Cardiovascular exam with heart regular rate and rhythm. Normal heart sounds, no murmurs present. No lower extremity edema present. Lungs clear to auscultation bilaterally. Follow-up in 4 weeks.     5. Episodic lightheadedness Patient reports feeling lightheaded prior to her psychogenic nonepileptic seizures.  No red flags present on exam. Elevated blood pressure with normal heart rate noted. Will check basic labs today including BMP, thyroid function and blood counts.  Normal heart sounds no murmurs present.  Heart exam with regular rate and rhythm. - Basic Metabolic Panel (BMET) - TSH Rfx on Abnormal to Free T4 - CBC with Differential/Platelet  6. Excessive thirst Will assess hemoglobin A1c today.  - Hemoglobin A1c  7. Muscle cramp Patient reports that she had muscle cramping/pain in her chest that she woke up with.  Most likely musculoskeletal in nature due to ability to reproduce pain with palpation.  Will also check electrolytes today. - Magnesium  8. Tobacco use disorder Patient patient is a current cigarette smoker.  Does not wish patient to quit at this time. Will continue to discuss with patient.    Return in about 4 weeks (around 12/19/2023) for mood f/u.   Alyson Reedy, FNP

## 2023-11-21 NOTE — Patient Instructions (Signed)
 Neurology should contact you in 10-14 business days  Psychiatry should contact you in 10-14 business days, please let us know if you have not heard anything. Start taking 25mg  Topamax every day at bedtime- increase by 25mg  every 7 days.   MyChart:  For all urgent or time sensitive needs we ask that you please call the office to avoid delays. Our number is [phone]. MyChart is not constantly monitored and due to the large volume of messages a day, replies may take up to 72 business hours.   MyChart Policy: MyChart allows for you to see your visit notes, after visit summary, provider recommendations, lab and tests results, make an appointment, request refills, and contact your provider or the office for non-urgent questions or concerns. Providers are seeing patients during normal business hours and do not have built in time to review MyChart messages.  We ask that you allow a minimum of 3 business days for responses to [employer]. For this reason, please do not send urgent requests through MyChart. Please call the office at [phone]. New and ongoing conditions may require a visit. We have virtual and in person visit available for your convenience.  Complex MyChart concerns may require a visit. Your provider may request you schedule a virtual or in person visit to ensure we are providing the best care possible. MyChart messages sent after 11:00 AM on Friday will not be received by the provider until Monday morning.    Lab and Test Results: You will receive your lab and test results on MyChart as soon as they are completed and results have been sent by the lab or testing facility. Due to this service, you will receive your results BEFORE your provider.  I review lab and tests results each morning prior to seeing patients. Some results require collaboration with other providers to ensure you are receiving the most appropriate care. For this reason, we ask that you please allow a minimum  of 3-5 business days from the time the ALL results have been received for your provider to receive and review lab and test results and contact you about these.  Most lab and test result comments from the provider will be sent through MyChart. Your provider may recommend changes to the plan of care, follow-up visits, repeat testing, ask questions, or request an office visit to discuss these results. You may reply directly to this message or call the office at [phone] to provide information for the provider or set up an appointment. In some instances, you will be called with test results and recommendations. Please let us know if this is preferred and we will make note of this in your chart to provide this for you.    If you have not heard a response to your lab or test results in 5 business days from all results returning to MyChart, please call the office to let us know. We ask that you please avoid calling prior to this time unless there is an emergent concern. Due to high call volumes, this can delay the resulting process.   After Hours: For all non-emergency after hours needs, please call the office at [phone] and select the option to reach the on-call provider service. On-call services are shared between multiple [place] offices and therefore it will not be possible to speak directly with your provider. On-call providers may provide medical advice and recommendations, but are unable to provide refills for maintenance medications.  For all emergency or urgent medical needs after  normal business hours, we recommend that you seek care at the closest Urgent Care or Emergency Department to ensure appropriate treatment in a timely manner.  [facility] has a 24 hour emergency room located on the ground floor for your convenience.    Urgent Concerns During the Business Day Providers are seeing patients from 8AM to 5PM, Monday through Thursday, and 8AM to 12PM on Friday  with a busy schedule and are most often not able to respond to non-urgent calls until the end of the day or the next business day. If you should have URGENT concerns during the day, please call and speak to the nurse or schedule a same day appointment so that we can address your concern without delay.    Thank you, again, for choosing me as your health care partner. I appreciate your trust and look forward to learning more about you.    Alyson Reedy, FNP-C

## 2023-11-22 LAB — BASIC METABOLIC PANEL
BUN/Creatinine Ratio: 16 (ref 9–23)
BUN: 13 mg/dL (ref 6–20)
CO2: 23 mmol/L (ref 20–29)
Calcium: 9.3 mg/dL (ref 8.7–10.2)
Chloride: 103 mmol/L (ref 96–106)
Creatinine, Ser: 0.8 mg/dL (ref 0.57–1.00)
Glucose: 79 mg/dL (ref 70–99)
Potassium: 3.9 mmol/L (ref 3.5–5.2)
Sodium: 140 mmol/L (ref 134–144)
eGFR: 100 mL/min/{1.73_m2} (ref >59)

## 2023-11-22 LAB — CBC WITH DIFFERENTIAL/PLATELET
Basophils Absolute: 0.1 10*3/uL (ref 0.0–0.2)
Basos: 1 %
EOS (ABSOLUTE): 0.2 10*3/uL (ref 0.0–0.4)
Eos: 2 %
Hematocrit: 41.5 % (ref 34.0–46.6)
Hemoglobin: 14.2 g/dL (ref 11.1–15.9)
Immature Grans (Abs): 0 10*3/uL (ref 0.0–0.1)
Immature Granulocytes: 0 %
Lymphocytes Absolute: 2.3 10*3/uL (ref 0.7–3.1)
Lymphs: 36 %
MCH: 35.9 pg — ABNORMAL HIGH (ref 26.6–33.0)
MCHC: 34.2 g/dL (ref 31.5–35.7)
MCV: 105 fL — ABNORMAL HIGH (ref 79–97)
Monocytes Absolute: 0.5 10*3/uL (ref 0.1–0.9)
Monocytes: 7 %
Neutrophils Absolute: 3.4 10*3/uL (ref 1.4–7.0)
Neutrophils: 54 %
Platelets: 234 10*3/uL (ref 150–450)
RBC: 3.96 x10E6/uL (ref 3.77–5.28)
RDW: 12.7 % (ref 11.7–15.4)
WBC: 6.4 10*3/uL (ref 3.4–10.8)

## 2023-11-22 LAB — MAGNESIUM: Magnesium: 1.6 mg/dL (ref 1.6–2.3)

## 2023-11-22 LAB — TSH RFX ON ABNORMAL TO FREE T4: TSH: 1.11 u[IU]/mL (ref 0.450–4.500)

## 2023-11-22 LAB — HEMOGLOBIN A1C
Est. average glucose Bld gHb Est-mCnc: 85 mg/dL
Hgb A1c MFr Bld: 4.6 % — ABNORMAL LOW (ref 4.8–5.6)

## 2023-11-23 ENCOUNTER — Encounter: Payer: Self-pay | Admitting: Family Medicine

## 2023-11-23 ENCOUNTER — Other Ambulatory Visit: Payer: Self-pay | Admitting: Family Medicine

## 2023-11-23 NOTE — Telephone Encounter (Signed)
 B12 and Folate has been added.

## 2023-11-23 NOTE — Telephone Encounter (Signed)
-----   Message from Alyson Reedy sent at 11/23/2023  1:45 PM EDT ----- Regarding: Add Labs Would you be able to add folate and vitamin B12 to her previously drawn labs? Thanks!

## 2023-11-24 LAB — SPECIMEN STATUS REPORT

## 2023-11-24 LAB — B12 AND FOLATE PANEL
Folate: 5.2 ng/mL (ref >3.0)
Vitamin B-12: 878 pg/mL (ref 232–1245)

## 2023-12-19 ENCOUNTER — Ambulatory Visit: Admitting: Family Medicine

## 2023-12-19 ENCOUNTER — Encounter: Payer: Self-pay | Admitting: Family Medicine

## 2023-12-19 VITALS — BP 138/92 | HR 95 | Ht 65.0 in | Wt 159.2 lb

## 2023-12-19 DIAGNOSIS — F5101 Primary insomnia: Secondary | ICD-10-CM | POA: Diagnosis not present

## 2023-12-19 DIAGNOSIS — F445 Conversion disorder with seizures or convulsions: Secondary | ICD-10-CM

## 2023-12-19 DIAGNOSIS — G43909 Migraine, unspecified, not intractable, without status migrainosus: Secondary | ICD-10-CM | POA: Diagnosis not present

## 2023-12-19 DIAGNOSIS — F330 Major depressive disorder, recurrent, mild: Secondary | ICD-10-CM

## 2023-12-19 DIAGNOSIS — F411 Generalized anxiety disorder: Secondary | ICD-10-CM | POA: Diagnosis not present

## 2023-12-19 DIAGNOSIS — R41840 Attention and concentration deficit: Secondary | ICD-10-CM

## 2023-12-19 MED ORDER — BUPROPION HCL ER (XL) 150 MG PO TB24: 150.0000 mg | ORAL_TABLET | Freq: Every morning | ORAL | 0 refills | Status: DC

## 2023-12-19 MED ORDER — FLUOXETINE HCL 20 MG PO CAPS
20.0000 mg | ORAL_CAPSULE | Freq: Every morning | ORAL | 0 refills | Status: DC
Start: 2023-12-19 — End: 2024-03-28

## 2023-12-19 MED ORDER — FLUOXETINE HCL 40 MG PO CAPS
40.0000 mg | ORAL_CAPSULE | Freq: Every day | ORAL | 0 refills | Status: DC
Start: 2023-12-19 — End: 2024-03-28

## 2023-12-19 MED ORDER — QUETIAPINE FUMARATE 50 MG PO TABS
75.0000 mg | ORAL_TABLET | Freq: Every day | ORAL | 0 refills | Status: DC
Start: 2023-12-19 — End: 2024-03-28

## 2023-12-19 MED ORDER — CLONAZEPAM 0.5 MG PO TABS
0.5000 mg | ORAL_TABLET | Freq: Two times a day (BID) | ORAL | 0 refills | Status: DC | PRN
Start: 2023-12-19 — End: 2024-01-26

## 2023-12-19 MED ORDER — TOPIRAMATE 100 MG PO TABS
100.0000 mg | ORAL_TABLET | Freq: Every day | ORAL | 3 refills | Status: DC
Start: 2023-12-19 — End: 2024-03-28

## 2023-12-19 NOTE — Progress Notes (Signed)
 Established Patient Office Visit  Subjective  Patient ID: Deanna Wong, female    DOB: 12/09/1990  Age: 33 y.o. MRN: [id number]  Chief Complaint  Patient presents with   Medical Management of Chronic Issues    4-week follow up; pt is here today for a med follow up; states she did see a psychiatrist but was unable to get a medication from her and states she is now out of one of her meds. Wants to discuss her attention span.   ANXIETY: Deanna Wong is a pleasant 33 year old female patient who presents for the medical management of anxiety.  Current medication regimen: prozac 40mg  daily & klonopin 0.5mg  daily PRN  Counseling: yes  Well controlled: no Denies SI/HI.   Psychiatry Rhea Pink)- [facility], unable to manage controlled substances d/t online  Last week on Thursday  Prozac 60mg  daily Wellbutrin 150mg  daily  Seroquel 75mg  at bedtime  Klonopin 0.5mg  takes it PRN, can be up to three times per day   Unable to sit down and thought it was r/t anxiety  Mom, dad and brothers had ADHD       12/19/2023    8:21 AM 11/21/2023    8:54 AM  GAD 7 : Generalized Anxiety Score  Nervous, Anxious, on Edge 3 3  Control/stop worrying 2 1  Worry too much - different things 3 1  Trouble relaxing 3 3  Restless 3 3  Easily annoyed or irritable 3 1  Afraid - awful might happen 2 3  Total GAD 7 Score 19 15  Anxiety Difficulty Very difficult Somewhat difficult      12/19/2023    8:21 AM 11/21/2023    8:52 AM  PHQ9 SCORE ONLY  PHQ-9 Total Score 12 9   ROS: see HPI     Objective:     BP (!) 138/92   Pulse 95   Ht 5\' 5"  (1.651 m)   Wt 159 lb 3.2 oz (72.2 kg)   LMP 12/15/2023 (Exact Date)   SpO2 98%   BMI 26.49 kg/m  BP Readings from Last 3 Encounters:  12/19/23 (!) 138/92  11/21/23 (!) 156/93  09/05/23 122/80     Physical Exam Vitals reviewed.  Constitutional:      Appearance: Normal appearance.  Cardiovascular:     Rate and Rhythm: Normal rate and regular  rhythm.     Pulses: Normal pulses.     Heart sounds: Normal heart sounds.  Pulmonary:     Effort: Pulmonary effort is normal.     Breath sounds: Normal breath sounds.  Neurological:     Mental Status: She is alert.  Psychiatric:        Mood and Affect: Mood normal.        Behavior: Behavior normal.     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 She reports she has noticed a difficult time with her mood. She was followed by Psychiatric Nurse Practitioner at [facility], Rhea Pink, PMHNP and did not have a pleasant visit with this provider recently. Advised patient to follow up with [facility] and call to schedule an appointment. Provided patient with refill of buproprion 150mg  daily. Counseled to notify office if she is unable to schedule an appointment in the near future.  - buPROPion (WELLBUTRIN XL) 150 MG 24 hr tablet; Take 1 tablet (150 mg total) by  mouth every morning.  Dispense: 90 tablet; Refill: 0  2. Generalized anxiety disorder GAD7 completed with score of 19, an increase from her previous score of 15. She was followed by Psychiatric Nurse Practitioner at [facility], Jordan Never, PMHNP and was unable to receive refill for Klonopin. Will obtain records from [facility]. CShe reports some days she does not need her Klonopin, and other days, she has issues with an increase in anxiety and will need it at least twice daily. urrent medication regimen includes Prozac 60mg  daily and Klonopin 0.5mg  twice daily PRN. PDMP reviewed, no red flags present. Refills sent to pharmacy on file. Discussed referral to [facility] psychiatry and advised her to call and schedule. Provided her with phone number to schedule a new patient appointment. Advised patient to reach out if she is unable to schedule.  - FLUoxetine (PROZAC) 40 MG capsule; Take 1 capsule  (40 mg total) by mouth daily.  Dispense: 90 capsule; Refill: 0 - FLUoxetine (PROZAC) 20 MG capsule; Take 1 capsule (20 mg total) by mouth every morning.  Dispense: 90 capsule; Refill: 0 - clonazePAM (KLONOPIN) 0.5 MG tablet; Take 1 tablet (0.5 mg total) by mouth 2 (two) times daily as needed for anxiety.  Dispense: 60 tablet; Refill: 0  3. Primary insomnia Patient reports occasional difficult time falling asleep. She does take Seroquel 50mg  at bedtime and has been on this dose for a while.   - QUEtiapine (SEROQUEL) 50 MG tablet; Take 1.5 tablets (75 mg total) by mouth at bedtime.  Dispense: 90 tablet; Refill: 0  4. Psychogenic nonepileptic seizure Patient reports tolerating an increase to 100mg  over the past few weeks. Patient requesting refill of Topamax 100mg  daily. Rx sent to pharmacy. She was referred to neurology but was unable to contact her to schedule. Will establish in the future if complications arise.  - topiramate (TOPAMAX) 100 MG tablet; Take 1 tablet (100 mg total) by mouth daily.  Dispense: 90 tablet; Refill: 3  5. Migraine syndrome Patient reports tolerating Topamax 100mg  daily and needs a refill at this time. Discussed establishing with neurology.  - topiramate (TOPAMAX) 100 MG tablet; Take 1 tablet (100 mg total) by mouth daily.  Dispense: 90 tablet; Refill: 3  6. Attention deficit Counseled patient to bring up at psychiatry appointment for formal testing of ADHD.    Return in about 8 weeks (around 02/13/2024) for Physical with fasting labs.    Wilhelmena Hanson, FNP

## 2023-12-19 NOTE — Patient Instructions (Signed)
 Please call and schedule a new patient appointment with [facility] [address] [place],  [place]  [address] Main: [phone]

## 2024-01-25 ENCOUNTER — Other Ambulatory Visit: Payer: Self-pay | Admitting: Family Medicine

## 2024-01-25 DIAGNOSIS — F411 Generalized anxiety disorder: Secondary | ICD-10-CM

## 2024-01-26 NOTE — Telephone Encounter (Signed)
 PDMP reviewed, no red flags present. Rx sent to pharmacy on file.

## 2024-01-31 ENCOUNTER — Ambulatory Visit: Admitting: Family Medicine

## 2024-03-01 ENCOUNTER — Other Ambulatory Visit: Payer: Self-pay | Admitting: Family Medicine

## 2024-03-01 DIAGNOSIS — F411 Generalized anxiety disorder: Secondary | ICD-10-CM

## 2024-03-06 ENCOUNTER — Encounter: Payer: Self-pay | Admitting: Family Medicine

## 2024-03-06 DIAGNOSIS — F411 Generalized anxiety disorder: Secondary | ICD-10-CM

## 2024-03-06 MED ORDER — CLONAZEPAM 0.5 MG PO TABS
0.5000 mg | ORAL_TABLET | Freq: Two times a day (BID) | ORAL | 0 refills | Status: DC | PRN
Start: 2024-03-06 — End: 2024-03-19

## 2024-03-15 ENCOUNTER — Ambulatory Visit: Payer: Self-pay | Admitting: Family Medicine

## 2024-03-19 ENCOUNTER — Ambulatory Visit: Payer: Self-pay | Admitting: Family Medicine

## 2024-03-19 ENCOUNTER — Encounter: Payer: Self-pay | Admitting: Family Medicine

## 2024-03-19 ENCOUNTER — Other Ambulatory Visit: Payer: Self-pay | Admitting: Family Medicine

## 2024-03-19 DIAGNOSIS — F411 Generalized anxiety disorder: Secondary | ICD-10-CM

## 2024-03-19 MED ORDER — CLONAZEPAM 0.5 MG PO TABS
0.5000 mg | ORAL_TABLET | Freq: Two times a day (BID) | ORAL | 0 refills | Status: DC | PRN
Start: 2024-03-19 — End: 2024-05-09

## 2024-03-19 NOTE — Progress Notes (Signed)
 PDMP reviewed, no red flags. Rx sent to pharmacy on file.

## 2024-03-28 ENCOUNTER — Ambulatory Visit (INDEPENDENT_AMBULATORY_CARE_PROVIDER_SITE_OTHER): Payer: Self-pay | Admitting: Family Medicine

## 2024-03-28 ENCOUNTER — Encounter: Payer: Self-pay | Admitting: Family Medicine

## 2024-03-28 VITALS — BP 122/89 | HR 76 | Resp 16 | Ht 65.0 in | Wt 148.0 lb

## 2024-03-28 DIAGNOSIS — Z23 Encounter for immunization: Secondary | ICD-10-CM

## 2024-03-28 DIAGNOSIS — F411 Generalized anxiety disorder: Secondary | ICD-10-CM

## 2024-03-28 DIAGNOSIS — F445 Conversion disorder with seizures or convulsions: Secondary | ICD-10-CM | POA: Diagnosis not present

## 2024-03-28 DIAGNOSIS — G43909 Migraine, unspecified, not intractable, without status migrainosus: Secondary | ICD-10-CM

## 2024-03-28 DIAGNOSIS — Z13220 Encounter for screening for lipoid disorders: Secondary | ICD-10-CM

## 2024-03-28 DIAGNOSIS — F330 Major depressive disorder, recurrent, mild: Secondary | ICD-10-CM | POA: Diagnosis not present

## 2024-03-28 DIAGNOSIS — F5101 Primary insomnia: Secondary | ICD-10-CM

## 2024-03-28 DIAGNOSIS — Z0000 Encounter for general adult medical examination without abnormal findings: Secondary | ICD-10-CM

## 2024-03-28 MED ORDER — TOPIRAMATE 100 MG PO TABS: 100.0000 mg | ORAL_TABLET | Freq: Every day | ORAL | 2 refills | Status: AC

## 2024-03-28 MED ORDER — QUETIAPINE FUMARATE 50 MG PO TABS
75.0000 mg | ORAL_TABLET | Freq: Every day | ORAL | 1 refills | Status: DC
Start: 2024-03-28 — End: 2024-05-09

## 2024-03-28 MED ORDER — BUPROPION HCL ER (XL) 150 MG PO TB24: 150.0000 mg | ORAL_TABLET | Freq: Every morning | ORAL | 1 refills | Status: DC

## 2024-03-28 MED ORDER — FLUOXETINE HCL 40 MG PO CAPS: 40.0000 mg | ORAL_CAPSULE | Freq: Every day | ORAL | 1 refills | Status: DC

## 2024-03-28 MED ORDER — FLUOXETINE HCL 20 MG PO CAPS: 20.0000 mg | ORAL_CAPSULE | Freq: Every morning | ORAL | 1 refills | Status: DC

## 2024-03-28 NOTE — Patient Instructions (Addendum)
 Smoking Cessation: QuitlineNC 1-800-QUIT-NOW (812) 489-1098); Espaol: 1-855-Djelo-Ya 270 267 1338) [email or web address]      [facility] [address] [place],  [place]  [address] Main: [phone]

## 2024-03-28 NOTE — Progress Notes (Signed)
 Subjective:   Deanna Wong [birth year]  03/28/2024   CC: Chief Complaint  Patient presents with   Annual Exam    Need Seroquel  refill and Clonazepam      HPI: Deanna Wong is a 33 y.o. female who presents for a routine health maintenance exam.  Fasting labs collected previously at initial visit.   HEALTH SCREENINGS: - Vision Screening: up to date - Dental Visits: plans to schedule - Pap smear: up to date - Breast Exam: up to date - STD Screening: Declined - Mammogram (40+): Not applicable  - Colonoscopy (45+): Not applicable  - Bone Density (65+ or under 65 with predisposing conditions): Not applicable  - Lung CA screening with low-dose CT:  Not applicable Adults age 23-80 who are current cigarette smokers or quit within the last 15 years. Must have 20 pack year history.   Depression and Anxiety Screen done today and results listed below:     03/28/2024    3:50 PM 12/19/2023    8:21 AM 11/21/2023    8:52 AM  Depression screen PHQ 2/9  Decreased Interest 1 1 1   Down, Depressed, Hopeless 1 1 2   PHQ - 2 Score 2 2 3   Altered sleeping 2 2 1   Tired, decreased energy 2 2 3   Change in appetite 2 1 0  Feeling bad or failure about yourself  1 0 0  Trouble concentrating 2 3 1   Moving slowly or fidgety/restless 2 2 1   Suicidal thoughts 0 0   PHQ-9 Score 13 12 9   Difficult doing work/chores Not difficult at all Very difficult Somewhat difficult      03/28/2024    3:50 PM 12/19/2023    8:21 AM 11/21/2023    8:54 AM  GAD 7 : Generalized Anxiety Score  Nervous, Anxious, on Edge 3 3 3   Control/stop worrying 2 2 1   Worry too much - different things 2 3 1   Trouble relaxing 3 3 3   Restless 3 3 3   Easily annoyed or irritable 2 3 1   Afraid - awful might happen 0 2 3  Total GAD 7 Score 15 19 15   Anxiety Difficulty  Very difficult Somewhat difficult    IMMUNIZATIONS: - Tdap: Tetanus vaccination status reviewed: Td vaccination indicated and given today. - HPV: declined,  check records  - Influenza: N/A - Prevnar 20: Refused - Zostavax (50+): Not applicable  Past medical history, surgical history, medications, allergies, family history and social history reviewed with patient today and changes made to appropriate areas of the chart.   Past Medical History:  Diagnosis Date   Anxiety    Depression    Ovarian cyst    PTSD (post-traumatic stress disorder)    Seizures (HCC)     Past Surgical History:  Procedure Laterality Date   CESAREAN SECTION     tubes in ears      Current Outpatient Medications on File Prior to Visit  Medication Sig   clonazePAM  (KLONOPIN ) 0.5 MG tablet Take 1 tablet (0.5 mg total) by mouth 2 (two) times daily as needed for anxiety.   ibuprofen (ADVIL,MOTRIN) 200 MG tablet Take 200 mg by mouth every 6 (six) hours as needed for cramping.   No current facility-administered medications on file prior to visit.    No Known Allergies   Social History   Socioeconomic History   Marital status: Married    Spouse name: Not on file   Number of children: Not on file   Years of education:  Not on file   Highest education level: Not on file  Occupational History   Not on file  Tobacco Use   Smoking status: Every Day    Current packs/day: 0.50    Average packs/day: 0.5 packs/day for 10.0 years (5.0 ttl pk-yrs)    Types: Cigarettes    Passive exposure: Never   Smokeless tobacco: Never  Vaping Use   Vaping status: Never Used  Substance and Sexual Activity   Alcohol use: Yes    Comment: occ   Drug use: No   Sexual activity: Not Currently  Other Topics Concern   Not on file  Social History Narrative   Not on file   Social Drivers of Health   Financial Resource Strain: Not on file  Food Insecurity: Not on file  Transportation Needs: Not on file  Physical Activity: Not on file  Stress: Not on file  Social Connections: Unknown (01/19/2022)   Received from [facility]   Social Network    Social Network: Not on file   Intimate Partner Violence: Unknown (12/11/2021)   Received from [facility]   HITS    Physically Hurt: Not on file    Insult or Talk Down To: Not on file    Threaten Physical Harm: Not on file    Scream or Curse: Not on file   Social History   Tobacco Use  Smoking Status Every Day   Current packs/day: 0.50   Average packs/day: 0.5 packs/day for 10.0 years (5.0 ttl pk-yrs)   Types: Cigarettes   Passive exposure: Never  Smokeless Tobacco Never   Social History   Substance and Sexual Activity  Alcohol Use Yes   Comment: occ    Family History  Problem Relation Age of Onset   ADD / ADHD Mother    Hypertension Mother    Stroke Mother    Kidney failure Mother    ADD / ADHD Father    ADD / ADHD Brother    Breast cancer Maternal Aunt      ROS: Denies fever, fatigue, unexplained weight loss/gain, chest pain, SHOB, and palpitations. Denies neurological deficits, gastrointestinal or genitourinary complaints, and skin changes.   Objective:   Today's Vitals   03/28/24 1552  BP: 122/89  Pulse: 76  Resp: 16  SpO2: 98%  Weight: 148 lb (67.1 kg)  Height: 5' 5 (1.651 m)  PainSc: 0-No pain    GENERAL APPEARANCE: Well-appearing, in NAD. Well nourished.  SKIN: Pink, warm and dry. Turgor normal. No rash, lesion, ulceration, or ecchymoses. Hair evenly distributed.  HEENT: HEAD: Normocephalic.  EYES: PERRLA. EOMI. Lids intact w/o defect. Sclera white, Conjunctiva pink w/o exudate.  EARS: External ear w/o redness, swelling, masses or lesions. EAC clear. TM's intact, translucent w/o bulging, appropriate landmarks visualized. Appropriate acuity to conversational tones.  NOSE: Septum midline w/o deformity. Nares patent, mucosa pink and non-inflamed w/o drainage. No sinus tenderness.  THROAT: Uvula midline. Oropharynx clear. Tonsils non-inflamed w/o exudate. Oral mucosa pink and moist.  NECK: Supple, Trachea midline. Full ROM w/o pain or tenderness. No lymphadenopathy. Thyroid  non-tender w/o enlargement or palpable masses.  BREASTS: Breasts pendulous, symmetrical, and w/o palpable masses. Nipples everted and w/o discharge. No rash or skin retraction. No axillary or supraclavicular lymphadenopathy.  RESPIRATORY: Chest wall symmetrical w/o masses. Respirations even and non-labored. Breath sounds clear to auscultation bilaterally. No wheezes, rales, rhonchi, or crackles. CARDIAC: S1, S2 present, regular rate and rhythm. No gallops, murmurs, rubs, or clicks. PMI w/o lifts, heaves, or thrills.  No carotid bruits. Capillary refill <2 seconds. Peripheral pulses 2+ bilaterally. GI: Abdomen soft w/o distention. Normoactive bowel sounds. No palpable masses or tenderness. No guarding or rebound tenderness. Liver and spleen w/o tenderness or enlargement. No CVA tenderness.  GU: External genitalia without erythema, lesions, or masses. No lymphadenopathy. Vaginal mucosa pink and moist without exudate, lesions, or ulcerations. Cervix pink without discharge. Cervical os closed. Uterus and adnexae palpable, not enlarged, and w/o tenderness. No palpable masses.  MSK: Muscle tone and strength appropriate for age, w/o atrophy or abnormal movement.  EXTREMITIES: Active ROM intact, w/o tenderness, crepitus, or contracture. No obvious joint deformities or effusions. No clubbing, edema, or cyanosis.  NEUROLOGIC: CN's II-XII intact. Motor strength symmetrical with no obvious weakness. No sensory deficits. DTR's 2+ symmetric bilaterally. Steady, even gait.  PSYCH/MENTAL STATUS: Alert, oriented x 3. Cooperative, appropriate mood and affect.   Chaperoned by Rexene Chestnut, CMA   Results for orders placed or performed in visit on 11/21/23  Basic Metabolic Panel (BMET)   Collection Time: 11/21/23  9:35 AM  Result Value Ref Range   Glucose 79 70 - 99 mg/dL   BUN 13 6 - 20 mg/dL   Creatinine, Ser 9.19 0.57 - 1.00 mg/dL   eGFR 899 >40 fO/fpw/8.26   BUN/Creatinine Ratio 16 9 - 23   Sodium 140 134 -  144 mmol/L   Potassium 3.9 3.5 - 5.2 mmol/L   Chloride 103 96 - 106 mmol/L   CO2 23 20 - 29 mmol/L   Calcium 9.3 8.7 - 10.2 mg/dL  TSH Rfx on Abnormal to Free T4   Collection Time: 11/21/23  9:35 AM  Result Value Ref Range   TSH 1.110 0.450 - 4.500 uIU/mL  CBC with Differential/Platelet   Collection Time: 11/21/23  9:35 AM  Result Value Ref Range   WBC 6.4 3.4 - 10.8 x10E3/uL   RBC 3.96 3.77 - 5.28 x10E6/uL   Hemoglobin 14.2 11.1 - 15.9 g/dL   Hematocrit 58.4 65.9 - 46.6 %   MCV 105 (H) 79 - 97 fL   MCH 35.9 (H) 26.6 - 33.0 pg   MCHC 34.2 31.5 - 35.7 g/dL   RDW 87.2 88.2 - 84.5 %   Platelets 234 150 - 450 x10E3/uL   Neutrophils 54 Not Estab. %   Lymphs 36 Not Estab. %   Monocytes 7 Not Estab. %   Eos 2 Not Estab. %   Basos 1 Not Estab. %   Neutrophils Absolute 3.4 1.4 - 7.0 x10E3/uL   Lymphocytes Absolute 2.3 0.7 - 3.1 x10E3/uL   Monocytes Absolute 0.5 0.1 - 0.9 x10E3/uL   EOS (ABSOLUTE) 0.2 0.0 - 0.4 x10E3/uL   Basophils Absolute 0.1 0.0 - 0.2 x10E3/uL   Immature Granulocytes 0 Not Estab. %   Immature Grans (Abs) 0.0 0.0 - 0.1 x10E3/uL  Hemoglobin A1c   Collection Time: 11/21/23  9:35 AM  Result Value Ref Range   Hgb A1c MFr Bld 4.6 (L) 4.8 - 5.6 %   Est. average glucose Bld gHb Est-mCnc 85 mg/dL  Magnesium   Collection Time: 11/21/23  9:35 AM  Result Value Ref Range   Magnesium 1.6 1.6 - 2.3 mg/dL  A87 and Folate Panel   Collection Time: 11/21/23  9:35 AM  Result Value Ref Range   Vitamin B-12 878 232 - 1,245 pg/mL   Folate 5.2 >3.0 ng/mL  Specimen status report   Collection Time: 11/21/23  9:35 AM  Result Value Ref Range   specimen status report Comment  Assessment & Plan:   1. Wellness examination (Primary) - Encouraged a healthy well-balanced diet. Patient may adjust caloric intake to maintain or achieve ideal body weight. May reduce intake of dietary saturated fat and total fat and have adequate dietary potassium and calcium preferably from fresh  fruits, vegetables, and low-fat dairy products.   - Advised to avoid cigarette smoking. - Discussed with the patient that most people either abstain from alcohol or drink within safe limits (<=14/week and <=4 drinks/occasion for males, <=7/weeks and <= 3 drinks/occasion for females) and that the risk for alcohol disorders and other health effects rises proportionally with the number of drinks per week and how often a drinker exceeds daily limits. - Discussed cessation/primary prevention of drug use and availability of treatment for abuse.  - Discussed sexually transmitted diseases, avoidance of unintended pregnancy and contraceptive alternatives. - Stressed the importance of regular exercise - Injury prevention: Discussed safety belts, safety helmets, smoke detector, smoking near bedding or upholstery.  - Dental health: Discussed importance of regular tooth brushing, flossing, and dental visits.  NEXT PREVENTATIVE PHYSICAL DUE IN 1 YEAR. - Lipid panel; Future  2. Generalized anxiety disorder Refills sent to pharmacy on file.  - Ambulatory referral to Psychiatry - FLUoxetine  (PROZAC ) 20 MG capsule; Take 1 capsule (20 mg total) by mouth every morning.  Dispense: 30 capsule; Refill: 1 - FLUoxetine  (PROZAC ) 40 MG capsule; Take 1 capsule (40 mg total) by mouth daily.  Dispense: 30 capsule; Refill: 1  3. Screening for lipid disorders Will obtain fasting lipid panel.  - Lipid panel; Future  4. Primary insomnia Refills sent to pharmacy on file.  - QUEtiapine  (SEROQUEL ) 50 MG tablet; Take 1.5 tablets (75 mg total) by mouth at bedtime.  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  - buPROPion  (WELLBUTRIN  XL) 150 MG 24 hr tablet; Take 1 tablet (150 mg total) by mouth every morning.  Dispense: 30 tablet; Refill: 1  6. Psychogenic nonepileptic seizure Refills sent to pharmacy on file.  - topiramate  (TOPAMAX ) 100 MG  tablet; Take 1 tablet (100 mg total) by mouth daily.  Dispense: 30 tablet; Refill: 2  7. Migraine syndrome Refills sent to pharmacy on file.  - topiramate  (TOPAMAX ) 100 MG tablet; Take 1 tablet (100 mg total) by mouth daily.  Dispense: 30 tablet; Refill: 2  8. Encounter for immunization Discussed Tdap immunization today. Patient is agreeable. Rx sent to pharmacy on file.  - Tdap vaccine greater than or equal to 7yo IM   Return in about 6 weeks (around 05/09/2024) for Mood f/u-- needs fasting lipid panel sometime .  Patient to reach out to office if new, worrisome, or unresolved symptoms arise or if no improvement in patient's condition. Patient verbalized understanding and is agreeable to treatment plan. All questions answered to patient's satisfaction.   Evalene Arts, FNP

## 2024-04-21 ENCOUNTER — Other Ambulatory Visit: Payer: Self-pay

## 2024-04-21 ENCOUNTER — Emergency Department
Admission: EM | Admit: 2024-04-21 | Discharge: 2024-04-21 | Disposition: A | Discharge status: Home / Self Care | Attending: Emergency Medicine | Admitting: Emergency Medicine

## 2024-04-21 ENCOUNTER — Emergency Department
Admission: EM | Admit: 2024-04-21 | Discharge: 2024-04-21 | Disposition: A | Discharge status: Against Medical Advice | Attending: Emergency Medicine | Admitting: Emergency Medicine

## 2024-04-21 DIAGNOSIS — R252 Cramp and spasm: Secondary | ICD-10-CM

## 2024-04-21 DIAGNOSIS — F445 Conversion disorder with seizures or convulsions: Secondary | ICD-10-CM | POA: Diagnosis not present

## 2024-04-21 DIAGNOSIS — E876 Hypokalemia: Secondary | ICD-10-CM | POA: Diagnosis present

## 2024-04-21 DIAGNOSIS — Z5321 Procedure and treatment not carried out due to patient leaving prior to being seen by health care provider: Secondary | ICD-10-CM | POA: Insufficient documentation

## 2024-04-21 LAB — COMPREHENSIVE METABOLIC PANEL WITH GFR
ALT: 33 U/L (ref 0–44)
AST: 45 U/L — ABNORMAL HIGH (ref 15–41)
Albumin: 4.1 g/dL (ref 3.5–5.0)
Alkaline Phosphatase: 41 U/L (ref 38–126)
Anion gap: 12 (ref 5–15)
BUN: 12 mg/dL (ref 6–20)
CO2: 28 mmol/L (ref 22–32)
Calcium: 8.8 mg/dL — ABNORMAL LOW (ref 8.9–10.3)
Chloride: 97 mmol/L — ABNORMAL LOW (ref 98–111)
Creatinine, Ser: 0.87 mg/dL (ref 0.44–1.00)
GFR, Estimated: >60 mL/min (ref >60)
Glucose, Bld: 97 mg/dL (ref 70–99)
Potassium: 2.7 mmol/L — CL (ref 3.5–5.1)
Sodium: 137 mmol/L (ref 135–145)
Total Bilirubin: 0.8 mg/dL (ref 0.0–1.2)
Total Protein: 7 g/dL (ref 6.5–8.1)

## 2024-04-21 LAB — MAGNESIUM: Magnesium: 1.6 mg/dL — ABNORMAL LOW (ref 1.7–2.4)

## 2024-04-21 LAB — CBC WITH DIFFERENTIAL/PLATELET
Abs Immature Granulocytes: 0.03 K/uL (ref 0.00–0.07)
Basophils Absolute: 0.1 K/uL (ref 0.0–0.1)
Basophils Relative: 1 %
Eosinophils Absolute: 0.1 K/uL (ref 0.0–0.5)
Eosinophils Relative: 2 %
HCT: 41.1 % (ref 36.0–46.0)
Hemoglobin: 15.4 g/dL — ABNORMAL HIGH (ref 12.0–15.0)
Immature Granulocytes: 0 %
Lymphocytes Relative: 37 %
Lymphs Abs: 3.3 K/uL (ref 0.7–4.0)
MCH: 35.4 pg — ABNORMAL HIGH (ref 26.0–34.0)
MCHC: 37.5 g/dL — ABNORMAL HIGH (ref 30.0–36.0)
MCV: 94.5 fL (ref 80.0–100.0)
Monocytes Absolute: 0.8 K/uL (ref 0.1–1.0)
Monocytes Relative: 9 %
Neutro Abs: 4.6 K/uL (ref 1.7–7.7)
Neutrophils Relative %: 51 %
Platelets: 297 K/uL (ref 150–400)
RBC: 4.35 MIL/uL (ref 3.87–5.11)
RDW: 12.1 % (ref 11.5–15.5)
WBC: 8.9 K/uL (ref 4.0–10.5)
nRBC: 0 % (ref 0.0–0.2)

## 2024-04-21 LAB — TSH: TSH: 0.852 u[IU]/mL (ref 0.350–4.500)

## 2024-04-21 LAB — PHOSPHORUS: Phosphorus: 1.9 mg/dL — ABNORMAL LOW (ref 2.5–4.6)

## 2024-04-21 LAB — HCG, QUANTITATIVE, PREGNANCY: hCG, Beta Chain, Quant, S: <1 m[IU]/mL (ref <5)

## 2024-04-21 LAB — T4, FREE: Free T4: 0.74 ng/dL (ref 0.61–1.12)

## 2024-04-21 MED ORDER — LORAZEPAM 1 MG PO TABS
1.0000 mg | ORAL_TABLET | Freq: Once | ORAL | Status: AC
  Administered 2024-04-21: 1 mg via ORAL
  Filled 2024-04-21: qty 1

## 2024-04-21 NOTE — ED Triage Notes (Signed)
 Pt via [facility] from home. Pt c/o cramping in the jaw and bilateral hand cramping. Tingling sensation in her face. Speech is clear. Reports symptoms started at 1330 today. Pt has a hx of focal and grand mal seizure, takes klonopin  as needed. Pt did take one today. Takes topamax  for seizure and has not missed any recent doses. Pt is A&Ox4 and NAD  96% on RA  140/90 BP  100 HR  18 G L FA,

## 2024-04-21 NOTE — ED Triage Notes (Signed)
 Pt was just discharged home before lab work was complete and called back by MD for hypokalemia. K+ 2.7, Pt has no complaints at this time. PMH: seizures, complaint with topamax . GCS 15, ambulatory in triage.

## 2024-04-21 NOTE — Discharge Instructions (Signed)
 Please call your psychiatrist on Monday to schedule follow-up appointment to see if you need to get your medications adjusted.

## 2024-04-21 NOTE — ED Notes (Signed)
 Pt was nowhere to be found in lobby when called several times. Tried to call pt phone, pt picked up and hung up.

## 2024-04-21 NOTE — ED Notes (Signed)
 Pt states that she wants to go home, sig other at bedside, pt ambulatory from department.

## 2024-04-21 NOTE — ED Notes (Signed)
 Pt in bed, pt states that she is having a hard time moving her mouth, no facial droop noted, pt also has cramping in bilateral hands, pt is generally weak, resps even and unlabored.

## 2024-04-21 NOTE — ED Provider Notes (Addendum)
 [facility] Provider Note    Event Date/Time   First MD Initiated Contact with Patient 04/21/24 1456     (approximate)   History   Seizures   HPI  LUIZA CARRANCO is a 33 y.o. female with history of anxiety, panic attacks, PTSD, nonepileptic seizures, presenting with inability to fully open her jaw as well is keeping her bilateral hands gripped.  Husband states that this is typical for her nonepileptic seizures.  No recent trauma or falls, she has been having a very stressful week.  No headache or vision changes, no focal weakness or numbness.  Does take Topamax  as well as Klonopin  as needed for her nonepileptic seizures.  No chest pain or shortness of breath, no infectious symptoms.  States that she has been compliant with her medications, denies any drug use, did have some alcohol use yesterday but none today.   Independent history obtained from husband as above.  On independent review, she was seen by her primary care provider at the end of July, does have GAD on Prozac , psychogenic nonepileptic seizures on Topamax .    Physical Exam   Triage Vital Signs: ED Triage Vitals  Encounter Vitals Group     BP 04/21/24 1457 135/89     Girls Systolic BP Percentile --      Girls Diastolic BP Percentile --      Boys Systolic BP Percentile --      Boys Diastolic BP Percentile --      Pulse Rate 04/21/24 1457 (!) 103     Resp 04/21/24 1457 18     Temp 04/21/24 1458 99.1 F (37.3 C)     Temp Source 04/21/24 1458 Oral     SpO2 04/21/24 1457 100 %     Weight 04/21/24 1456 130 lb (59 kg)     Height 04/21/24 1456 5' 4 (1.626 m)     Head Circumference --      Peak Flow --      Pain Score 04/21/24 1456 0     Pain Loc --      Pain Education --      Exclude from Growth Chart --     Most recent vital signs: Vitals:   04/21/24 1457 04/21/24 1458  BP: 135/89   Pulse: (!) 103   Resp: 18   Temp:  99.1 F (37.3 C)  SpO2: 100%      General: Awake, no  distress.  CV:  Good peripheral perfusion.  Resp:  Normal effort.  No tachypnea or respiratory distress Abd:  No distention.  Soft nontender Other:  No focal weakness or numbness, her bilateral hands are clenched but able to coax them open, no focal weakness or numbness, no palpable deformities or tenderness to extremities, patient has no focal cranial nerve deficits, pupils are equal and reactive, extraocular movements are intact, she has no slurred speech, patient initially unable to fully open her jaw but with some coaxing was able to do so.  No tenderness to her TMJ or her jaw.   ED Results / Procedures / Treatments   Labs (all labs ordered are listed, but only abnormal results are displayed) Labs Reviewed  CBC WITH DIFFERENTIAL/PLATELET - Abnormal; Notable for the following components:      Result Value   Hemoglobin 15.4 (*)    MCH 35.4 (*)    MCHC 37.5 (*)    All other components within normal limits  COMPREHENSIVE METABOLIC PANEL WITH GFR  TSH  T4, FREE  MAGNESIUM  PHOSPHORUS  HCG, QUANTITATIVE, PREGNANCY     EKG  EKG shows, sinus rhythm heart rate 98, normal QRS, prolonged QTc, no obvious ischemic ST elevation, T wave flattening in 1, aVL, T wave flattening is new compared to prior   PROCEDURES:  Critical Care performed: No  Procedures   MEDICATIONS ORDERED IN ED: Medications  LORazepam  (ATIVAN ) tablet 1 mg (1 mg Oral Given 04/21/24 1539)     IMPRESSION / MDM / ASSESSMENT AND PLAN / ED COURSE  I reviewed the triage vital signs and the nursing notes.                              Differential diagnosis includes, but is not limited to, psychogenic nonepileptic seizures, tach, electrolyte derangements, thyroid  dysfunction.  Labs, will give her an Ativan  here.  Offered psychiatry but patient declined.  Patient's presentation is most consistent with acute presentation with potential threat to life or bodily function.  Independent interpretation of labs and  imaging below.  Patient was given a dose of Ativan , now states that she is feeling better and would like to go home.  Does not want to wait for the rest of the labs to come back.  Understands risk of leaving prior to lab results including severe electrolyte derangements.  Discussed with patient  that she needs to call her primary care doctor in the morning on Monday to schedule follow-up appointment.  Instructed patient to follow-up with her psychiatrist this week to get reassessed as well as to see if she needs any meds adjustments.  I believe the patient has capacity, she is not intoxicated and understands the risk of leaving prior to her workup being done.  She will call her primary care doctor as well psychiatrist on Monday.  Discharged with strict return precautions, and also told her that I will call if lab abnormalities are severe and she needs to present back to the emergency department.  She is agreeable with this plan.   4:59 PM: Reviewed patient's labs, she is hypokalemic, hypomagnesemic, hypophosphatemic, called patient and instructed patient and husband to return to the emergency department because she likely needs to be seen and get electrolyte replacements, might also need admission.  They are agreeable to come back.  Will let the charge nurse know.     FINAL CLINICAL IMPRESSION(S) / ED DIAGNOSES   Final diagnoses:  Psychogenic nonepileptic seizure  Cramping of hands     Rx / DC Orders   ED Discharge Orders     None        Note:  This document was prepared using Dragon voice recognition software and may include unintentional dictation errors.    Waymond Lorelle Cummins, MD 04/21/24 8397    Waymond Lorelle Cummins, MD 04/21/24 1700

## 2024-04-27 ENCOUNTER — Other Ambulatory Visit: Payer: Self-pay | Admitting: Family Medicine

## 2024-04-27 DIAGNOSIS — F411 Generalized anxiety disorder: Secondary | ICD-10-CM

## 2024-04-27 DIAGNOSIS — F330 Major depressive disorder, recurrent, mild: Secondary | ICD-10-CM

## 2024-04-28 ENCOUNTER — Other Ambulatory Visit: Payer: Self-pay | Admitting: Family Medicine

## 2024-04-28 DIAGNOSIS — F411 Generalized anxiety disorder: Secondary | ICD-10-CM

## 2024-05-09 ENCOUNTER — Encounter: Payer: Self-pay | Admitting: Family Medicine

## 2024-05-09 ENCOUNTER — Ambulatory Visit (INDEPENDENT_AMBULATORY_CARE_PROVIDER_SITE_OTHER): Admitting: Family Medicine

## 2024-05-09 VITALS — BP 118/86 | HR 91 | Resp 16 | Ht 64.0 in | Wt 136.0 lb

## 2024-05-09 DIAGNOSIS — E876 Hypokalemia: Secondary | ICD-10-CM | POA: Diagnosis not present

## 2024-05-09 DIAGNOSIS — F411 Generalized anxiety disorder: Secondary | ICD-10-CM | POA: Diagnosis not present

## 2024-05-09 DIAGNOSIS — F5101 Primary insomnia: Secondary | ICD-10-CM

## 2024-05-09 MED ORDER — QUETIAPINE FUMARATE 50 MG PO TABS: 75.0000 mg | ORAL_TABLET | Freq: Every day | ORAL | 1 refills | Status: AC

## 2024-05-09 MED ORDER — CLONAZEPAM 0.5 MG PO TABS: 0.5000 mg | ORAL_TABLET | Freq: Two times a day (BID) | ORAL | 0 refills | Status: DC | PRN

## 2024-05-09 NOTE — Patient Instructions (Signed)
 [facility] Phone: [phone] Various locations- [employer]

## 2024-05-09 NOTE — Progress Notes (Signed)
 Established Patient Office Visit  Subjective  Patient ID: Deanna Wong, female    DOB: 03-27-1991  Age: 33 y.o. MRN: [id number]  Chief Complaint  Patient presents with   MOOD    See ED labs Potassium low will need repeat soon/   Discussed the use of AI scribe software for clinical note transcription with the patient, who gave verbal consent to proceed.  History of Present Illness   Deanna Wong is a 33 year old female who presents for follow-up for medical management of anxiety. Current medication regimen includes prozac  60mg  daily, wellbutrin  150mg  XL daily, and klonopin  0.5mg  BID PRN. She is here after a recent emergency department visit.  She recently visited the emergency department due to symptoms mimicking a stroke, including jaw and hand lock-up and inability to speak. Her husband called an ambulance when her face appeared to be having a stroke. At the hospital, she was given Ativan  for anxiety, which alleviated her symptoms. Blood work revealed a critically low potassium level of 2.7 mEq/L. She opted to leave the hospital and manage her potassium levels with supplements and dietary changes, such as eating bananas. These symptoms have not recurred since then, although she has experienced low potassium levels in the past.  She reports low magnesium , calcium, and phosphorus levels. No strenuous activity was noted prior to the episode, and her kidney function was normal.  She is currently taking 60 mg of Prozac  and 150 mg of Wellbutrin  for mood stabilization. Significant work-related stress led to her leaving her job. She is taking two weeks off before starting a new job and is focusing on relaxation, including getting massages.       05/09/2024    3:41 PM 03/28/2024    3:50 PM 12/19/2023    8:21 AM 11/21/2023    8:54 AM  GAD 7 : Generalized Anxiety Score  Nervous, Anxious, on Edge 2 3 3 3   Control/stop worrying 2 2 2 1   Worry too much - different things 2 2 3 1   Trouble  relaxing 3 3 3 3   Restless 3 3 3 3   Easily annoyed or irritable 3 2 3 1   Afraid - awful might happen 1 0 2 3  Total GAD 7 Score 16 15 19 15   Anxiety Difficulty Very difficult  Very difficult Somewhat difficult      05/09/2024    3:40 PM 03/28/2024    3:50 PM 12/19/2023    8:21 AM  PHQ9 SCORE ONLY  PHQ-9 Total Score 10 13  12       Data saved with a previous flowsheet row definition   ROS: see HPI     Objective:     BP 118/86   Pulse 91   Resp 16   Ht 5' 4 (1.626 m)   Wt 136 lb (61.7 kg)   LMP 04/20/2024   SpO2 99%   BMI 23.34 kg/m  BP Readings from Last 3 Encounters:  05/09/24 118/86  04/21/24 118/84  03/28/24 122/89     Physical Exam Constitutional:      Appearance: Normal appearance.  Cardiovascular:     Rate and Rhythm: Normal rate and regular rhythm.     Pulses: Normal pulses.     Heart sounds: Normal heart sounds.  Pulmonary:     Effort: Pulmonary effort is normal.     Breath sounds: Normal breath sounds.  Neurological:     Mental Status: She is alert.  Psychiatric:  Mood and Affect: Mood normal.        Behavior: Behavior normal.     Assessment & Plan:   1. Generalized anxiety disorder (Primary) Managed with Prozac  and Wellbutrin , as well as Klonopin  0.5mg  BID PRN. Increased anxiety and stress due to work changes. Klonopin  and Seroquel  refills needed. Provide phone number for behavioral health services to further management of anxiety. PDMP reviewed, no red flags present. Rx sent to pharmacy on file. Continue current medication regimen.  - clonazePAM  (KLONOPIN ) 0.5 MG tablet; Take 1 tablet (0.5 mg total) by mouth 2 (two) times daily as needed for anxiety.  Dispense: 60 tablet; Refill: 0  2. Primary insomnia Rx sent to pharmacy on file.  - QUEtiapine  (SEROQUEL ) 50 MG tablet; Take 1.5 tablets (75 mg total) by mouth at bedtime.  Dispense: 90 tablet; Refill: 1  3. Hypokalemia Severe hypokalemia at 2.7 with low magnesium , calcium, and phosphorus,  likely due to dehydration. Symptoms included muscle spasms and facial drooping. Normal kidney function. Repeat electrolyte panel to assess current levels. - Comprehensive metabolic panel with GFR  4. Hypomagnesemia Will check magnesium .  - Magnesium   5. Hypophosphatemia Will check phosphorus.  - Phosphorus  Return in about 6 weeks (around 06/20/2024) for Mood f/u.    Evalene Arts, FNP

## 2024-05-10 LAB — COMPREHENSIVE METABOLIC PANEL WITH GFR
ALT: 32 IU/L (ref 0–32)
AST: 44 IU/L — ABNORMAL HIGH (ref 0–40)
Albumin: 4.7 g/dL (ref 3.9–4.9)
Alkaline Phosphatase: 55 IU/L (ref 44–121)
BUN/Creatinine Ratio: 10 (ref 9–23)
BUN: 10 mg/dL (ref 6–20)
Bilirubin Total: 0.3 mg/dL (ref 0.0–1.2)
CO2: 19 mmol/L — ABNORMAL LOW (ref 20–29)
Calcium: 9.5 mg/dL (ref 8.7–10.2)
Chloride: 98 mmol/L (ref 96–106)
Creatinine, Ser: 0.96 mg/dL (ref 0.57–1.00)
Globulin, Total: 2.4 g/dL (ref 1.5–4.5)
Glucose: 132 mg/dL — ABNORMAL HIGH (ref 70–99)
Potassium: 3 mmol/L — ABNORMAL LOW (ref 3.5–5.2)
Sodium: 136 mmol/L (ref 134–144)
Total Protein: 7.1 g/dL (ref 6.0–8.5)
eGFR: 80 mL/min/1.73 (ref >59)

## 2024-05-10 LAB — MAGNESIUM: Magnesium: 1.8 mg/dL (ref 1.6–2.3)

## 2024-05-10 LAB — PHOSPHORUS: Phosphorus: 2.4 mg/dL — ABNORMAL LOW (ref 3.0–4.3)

## 2024-05-11 ENCOUNTER — Telehealth: Payer: Self-pay | Admitting: Family Medicine

## 2024-05-11 DIAGNOSIS — E876 Hypokalemia: Secondary | ICD-10-CM

## 2024-05-11 MED ORDER — MAGNESIUM OXIDE 400 MG PO TABS: 400.0000 mg | ORAL_TABLET | Freq: Every day | ORAL | 0 refills | Status: AC

## 2024-05-11 MED ORDER — POTASSIUM CHLORIDE CRYS ER 10 MEQ PO TBCR: 10.0000 meq | EXTENDED_RELEASE_TABLET | Freq: Two times a day (BID) | ORAL | 0 refills | Status: DC

## 2024-05-11 NOTE — Telephone Encounter (Signed)
 Spoke with patient on phone and verified identification using two forms. Discussed recent lab work. She reports feeling more stressed over the past few weeks and she let it become too much. She reports she was more anxious and stressed, most likely related to her job that she would feel physically nauseous and vomit during this period of time. She reports that she was always nauseous and vomiting, she decreased her food intake and doesn't necessarily have an appetite. Encouraged fluid intake and small, nutritious meals. Will prescribe potassium and magnesium  supplements for replacement and repeat electrolyte levels in 2 weeks. Advised patient to reach out if her anxiety worsens or medications do not seem to be helping. Patient verbalized an understanding and all questions answered.   Evalene Arts, FNP-C [facility]  [address], [place], [place] [address] [phone]

## 2024-05-18 ENCOUNTER — Ambulatory Visit

## 2024-05-18 ENCOUNTER — Ambulatory Visit (INDEPENDENT_AMBULATORY_CARE_PROVIDER_SITE_OTHER)

## 2024-05-18 DIAGNOSIS — Z0000 Encounter for general adult medical examination without abnormal findings: Secondary | ICD-10-CM

## 2024-05-18 DIAGNOSIS — Z13220 Encounter for screening for lipoid disorders: Secondary | ICD-10-CM

## 2024-05-19 LAB — LIPID PANEL
Chol/HDL Ratio: 2.9 ratio (ref 0.0–4.4)
Cholesterol, Total: 219 mg/dL — ABNORMAL HIGH (ref 100–199)
HDL: 75 mg/dL (ref >39)
LDL Chol Calc (NIH): 117 mg/dL — ABNORMAL HIGH (ref 0–99)
Triglycerides: 159 mg/dL — ABNORMAL HIGH (ref 0–149)
VLDL Cholesterol Cal: 27 mg/dL (ref 5–40)

## 2024-05-21 ENCOUNTER — Ambulatory Visit: Payer: Self-pay | Admitting: Family Medicine

## 2024-06-20 ENCOUNTER — Ambulatory Visit: Admitting: Family Medicine

## 2024-06-25 ENCOUNTER — Ambulatory Visit: Admitting: Family Medicine

## 2024-07-02 ENCOUNTER — Ambulatory Visit: Admitting: Family Medicine

## 2024-07-09 ENCOUNTER — Ambulatory Visit: Admitting: Family Medicine

## 2024-07-09 DIAGNOSIS — F331 Major depressive disorder, recurrent, moderate: Secondary | ICD-10-CM

## 2024-07-09 DIAGNOSIS — F411 Generalized anxiety disorder: Secondary | ICD-10-CM

## 2024-07-09 DIAGNOSIS — F330 Major depressive disorder, recurrent, mild: Secondary | ICD-10-CM | POA: Insufficient documentation

## 2024-07-09 MED ORDER — FLUOXETINE HCL 20 MG PO CAPS: 20.0000 mg | ORAL_CAPSULE | Freq: Every morning | ORAL | 3 refills | Status: AC

## 2024-07-09 MED ORDER — FLUOXETINE HCL 40 MG PO CAPS: 40.0000 mg | ORAL_CAPSULE | Freq: Every day | ORAL | 3 refills | Status: AC

## 2024-07-09 MED ORDER — BUPROPION HCL ER (XL) 150 MG PO TB24
150.0000 mg | ORAL_TABLET | Freq: Every morning | ORAL | 3 refills | Status: AC
Start: 2024-07-09 — End: ?

## 2024-07-09 NOTE — Progress Notes (Signed)
 Established Patient Office Visit  Subjective  Patient ID: Deanna Wong, female    DOB: 1991/02/22  Age: 33 y.o. MRN: [id number]  Chief Complaint  Patient presents with   Follow-up    Pt states she is unhappy with the therapist... She has not been doing well as they made medication changes   Discussed the use of AI scribe software for clinical note transcription with the patient, who gave verbal consent to proceed.  History of Present Illness   Deanna Wong is a 33 year old female who presents for mood follow-up and medication management concerns.  She has been receiving treatment for anxiety and depression at [facility] and recently expressed concerns about ADHD. Her medication regimen was altered by a provider she did not feel connected with, leading to negative effects. She was taken off Wellbutrin , which she believes has resulted in a significant decline in her mood and energy levels. She describes feeling 'very depressed' and lacking motivation since stopping Wellbutrin . She did not notice the benefits of Wellbutrin  until she stopped taking it, at which point she felt 'awful' and had 'no energy'.  She is currently out of Prozac  but does not believe it was adjusted by the previous provider. She continues to take Seroquel , which was increased to 100 mg, and she picked up a new supply yesterday. She reports that Seroquel  helps her sleep.  She sought evaluation for ADHD due to symptoms of restlessness, difficulty concentrating, and frequently interrupting others. However, the previous provider did not pursue ADHD testing and instead diagnosed her with bipolar disorder, a diagnosis she disagrees with. She notes a family history of bipolar disorder, with her father and older brother both diagnosed, but she does not identify with their aggressive and euphoric behaviors.        07/09/2024    8:33 AM 05/09/2024    3:41 PM 03/28/2024    3:50 PM 12/19/2023    8:21 AM  GAD 7 :  Generalized Anxiety Score  Nervous, Anxious, on Edge 3 2 3 3   Control/stop worrying 2 2 2 2   Worry too much - different things 2 2 2 3   Trouble relaxing 3 3 3 3   Restless 3 3 3 3   Easily annoyed or irritable 2 3 2 3   Afraid - awful might happen 1 1 0 2  Total GAD 7 Score 16 16 15 19   Anxiety Difficulty Very difficult Very difficult  Very difficult      07/09/2024    8:26 AM 05/09/2024    3:40 PM 03/28/2024    3:50 PM  PHQ9 SCORE ONLY  PHQ-9 Total Score 15 10 13       Data saved with a previous flowsheet row definition   ROS: see HPI     Objective:     BP 123/87 (BP Location: Left Arm, Patient Position: Sitting, Cuff Size: Normal)   Pulse 69   Temp 98.6 F (37 C) (Oral)   Wt 134 lb (60.8 kg)   BMI 23.00 kg/m  BP Readings from Last 3 Encounters:  07/09/24 123/87  05/09/24 118/86  04/21/24 118/84     Physical Exam Vitals reviewed.  Constitutional:      Appearance: Normal appearance.  Cardiovascular:     Rate and Rhythm: Normal rate and regular rhythm.     Pulses: Normal pulses.     Heart sounds: Normal heart sounds.  Pulmonary:     Effort: Pulmonary effort is normal.     Breath sounds:  Normal breath sounds.  Neurological:     Mental Status: She is alert.  Psychiatric:        Mood and Affect: Mood normal.        Behavior: Behavior normal.     Assessment & Plan:   1. Generalized anxiety disorder See #2 - FLUoxetine  (PROZAC ) 20 MG capsule; Take 1 capsule (20 mg total) by mouth every morning.  Dispense: 90 capsule; Refill: 3 - FLUoxetine  (PROZAC ) 40 MG capsule; Take 1 capsule (40 mg total) by mouth daily.  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 Mood and energy levels declined after stopping Wellbutrin , indicating its benefit. Currently on Prozac  and Seroquel . Disagrees with bipolar diagnosis and seeks second opinion. She requested to see a different provider at [facility]  and is awaiting to be scheduled with a new provider. Interested in ADHD evaluation. Restart Wellbutrin  as previously prescribed. Refill Prozac  prescription. Request records from [facility]. Consider referral to [facility] for ADHD testing if not addressed by new psychiatrist.  - buPROPion  (WELLBUTRIN  XL) 150 MG 24 hr tablet; Take 1 tablet (150 mg total) by mouth every morning.  Dispense: 90 tablet; Refill: 3    Return in about 3 months (around 10/09/2024) for Mood f/u.    Evalene Arts, FNP

## 2024-07-16 ENCOUNTER — Other Ambulatory Visit: Payer: Self-pay | Admitting: Family Medicine

## 2024-07-16 DIAGNOSIS — F411 Generalized anxiety disorder: Secondary | ICD-10-CM

## 2024-07-24 ENCOUNTER — Encounter: Payer: Self-pay | Admitting: Family Medicine

## 2024-07-26 ENCOUNTER — Other Ambulatory Visit: Payer: Self-pay | Admitting: Family Medicine

## 2024-07-26 DIAGNOSIS — F411 Generalized anxiety disorder: Secondary | ICD-10-CM

## 2024-07-26 MED ORDER — CLONAZEPAM 0.5 MG PO TABS: 0.5000 mg | ORAL_TABLET | Freq: Two times a day (BID) | ORAL | 0 refills | Status: AC | PRN

## 2024-08-12 ENCOUNTER — Other Ambulatory Visit: Payer: Self-pay | Admitting: Family Medicine

## 2024-08-12 DIAGNOSIS — F411 Generalized anxiety disorder: Secondary | ICD-10-CM

## 2024-08-21 ENCOUNTER — Encounter: Payer: Self-pay | Admitting: Family Medicine
